# Patient Record
Sex: Female | Born: 1985 | Race: Black or African American | Hispanic: No | Marital: Single | Smoking: Former smoker
Health system: Southern US, Community
[De-identification: ages and names within clinical notes are randomized; demographics above are authoritative.]

## PROBLEM LIST (undated history)

## (undated) DIAGNOSIS — G43909 Migraine, unspecified, not intractable, without status migrainosus: Secondary | ICD-10-CM

## (undated) DIAGNOSIS — M419 Scoliosis, unspecified: Secondary | ICD-10-CM

## (undated) DIAGNOSIS — R011 Cardiac murmur, unspecified: Secondary | ICD-10-CM

## (undated) DIAGNOSIS — F410 Panic disorder [episodic paroxysmal anxiety] without agoraphobia: Secondary | ICD-10-CM

## (undated) DIAGNOSIS — R87629 Unspecified abnormal cytological findings in specimens from vagina: Secondary | ICD-10-CM

## (undated) HISTORY — DX: Unspecified abnormal cytological findings in specimens from vagina: R87.629

## (undated) HISTORY — DX: Cardiac murmur, unspecified: R01.1

## (undated) HISTORY — DX: Panic disorder (episodic paroxysmal anxiety): F41.0

## (undated) HISTORY — DX: Migraine, unspecified, not intractable, without status migrainosus: G43.909

---
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
## 2008-01-01 ENCOUNTER — Emergency Department (HOSPITAL_COMMUNITY): Admission: EM | Admit: 2008-01-01 | Discharge: 2008-01-01 | Payer: Self-pay | Admitting: Emergency Medicine

## 2010-04-19 ENCOUNTER — Emergency Department (HOSPITAL_BASED_OUTPATIENT_CLINIC_OR_DEPARTMENT_OTHER): Admission: EM | Admit: 2010-04-19 | Discharge: 2010-04-19 | Payer: Self-pay | Admitting: Emergency Medicine

## 2010-04-19 ENCOUNTER — Ambulatory Visit: Payer: Self-pay | Admitting: Diagnostic Radiology

## 2011-09-06 LAB — WET PREP, GENITAL
Clue Cells Wet Prep HPF POC: NONE SEEN
Trich, Wet Prep: NONE SEEN

## 2011-09-06 LAB — URINALYSIS, ROUTINE W REFLEX MICROSCOPIC
Glucose, UA: NEGATIVE
Hgb urine dipstick: NEGATIVE
Ketones, ur: >80 — AB
Nitrite: NEGATIVE
Protein, ur: NEGATIVE
Specific Gravity, Urine: 1.021
Urobilinogen, UA: 0.2
pH: 6

## 2011-09-06 LAB — GC/CHLAMYDIA PROBE AMP, GENITAL: Chlamydia, DNA Probe: NEGATIVE

## 2011-09-06 LAB — POCT PREGNANCY, URINE
Operator id: 198171
Preg Test, Ur: NEGATIVE

## 2012-04-13 ENCOUNTER — Telehealth: Payer: Self-pay | Admitting: Hematology and Oncology

## 2012-04-13 NOTE — Telephone Encounter (Signed)
lmonvm of the pt adviisng him of the new pt appt with dr odogwu on 04/17/2012. Asked the pt to call back to confirm the message.

## 2012-04-16 ENCOUNTER — Telehealth: Payer: Self-pay | Admitting: Hematology and Oncology

## 2012-04-16 NOTE — Telephone Encounter (Signed)
Referred by Dr. Shaune Pollack Dx- Neutropenia

## 2012-04-17 ENCOUNTER — Ambulatory Visit: Payer: Self-pay

## 2012-04-17 ENCOUNTER — Ambulatory Visit: Payer: Self-pay | Admitting: Hematology and Oncology

## 2013-01-27 ENCOUNTER — Emergency Department (INDEPENDENT_AMBULATORY_CARE_PROVIDER_SITE_OTHER)
Admission: EM | Admit: 2013-01-27 | Discharge: 2013-01-27 | Disposition: A | Discharge status: Home / Self Care | Payer: BC Managed Care – PPO | Source: Home / Self Care | Attending: Emergency Medicine | Admitting: Emergency Medicine

## 2013-01-27 ENCOUNTER — Encounter (HOSPITAL_COMMUNITY): Payer: Self-pay | Admitting: *Deleted

## 2013-01-27 ENCOUNTER — Other Ambulatory Visit (HOSPITAL_COMMUNITY)
Admission: RE | Admit: 2013-01-27 | Discharge: 2013-01-27 | Disposition: A | Discharge status: Home / Self Care | Payer: BC Managed Care – PPO | Source: Ambulatory Visit | Attending: Emergency Medicine | Admitting: Emergency Medicine

## 2013-01-27 DIAGNOSIS — N760 Acute vaginitis: Secondary | ICD-10-CM | POA: Insufficient documentation

## 2013-01-27 DIAGNOSIS — Z113 Encounter for screening for infections with a predominantly sexual mode of transmission: Secondary | ICD-10-CM | POA: Insufficient documentation

## 2013-01-27 DIAGNOSIS — N898 Other specified noninflammatory disorders of vagina: Secondary | ICD-10-CM

## 2013-01-27 LAB — POCT URINALYSIS DIP (DEVICE)
Glucose, UA: NEGATIVE mg/dL
Nitrite: NEGATIVE

## 2013-01-27 MED ORDER — METRONIDAZOLE 500 MG PO TABS: 500.0000 mg | ORAL_TABLET | Freq: Two times a day (BID) | ORAL | Status: DC

## 2013-01-27 MED ORDER — FLUCONAZOLE 150 MG PO TABS: 150.0000 mg | ORAL_TABLET | Freq: Once | ORAL | Status: DC

## 2013-01-27 NOTE — ED Provider Notes (Signed)
History     CSN: [id number]  Arrival date & time 01/27/13  1151   First MD Initiated Contact with Patient 01/27/13 1159      Chief Complaint  Patient presents with  . Vaginal Discharge    (Consider location/radiation/quality/duration/timing/severity/associated sxs/prior treatment) HPI Comments: Patient presents urgent care describing that she has been having a vaginal discharge some degree of irritation in her external genitalia for about a week. She states that she experiences this sporadic and recurrent episodes of discharge and irritation after she finishes with her periods. In the past she has been diagnosed with both bacterial vaginosis and focal infections. Patient describes she has no concerns for STDs as she is not currently sexually active for many months and has no concerns for a potential exposure, as she has had negative test in the recent past. Patient denies any pelvic pain, urinary symptoms fevers flank pain or vaginal bleeding. Also denies any rashes to her external genitalia.  Patient is a 27 y.o. female presenting with vaginal discharge. The history is provided by the patient.  Vaginal Discharge This is a new problem. The current episode started more than 1 week ago. The problem occurs constantly. The problem has been gradually worsening. Pertinent negatives include no abdominal pain. Nothing aggravates the symptoms. Nothing relieves the symptoms. She has tried nothing for the symptoms.    History reviewed. No pertinent past medical history.  History reviewed. No pertinent past surgical history.  History reviewed. No pertinent family history.  History  Substance Use Topics  . Smoking status: Current Every Day Smoker  . Smokeless tobacco: Not on file  . Alcohol Use: Yes    OB History   Grav Para Term Preterm Abortions TAB SAB Ect Mult Living                  Review of Systems  Constitutional: Negative for activity change and appetite change.   Gastrointestinal: Negative for abdominal pain.  Genitourinary: Positive for vaginal discharge. Negative for dysuria, urgency, frequency, flank pain, decreased urine volume, vaginal bleeding and genital sores.    Allergies  Review of patient's allergies indicates no known allergies.  Home Medications   Current Outpatient Rx  Name  Route  Sig  Dispense  Refill  . metroNIDAZOLE (FLAGYL) 500 MG tablet   Oral   Take 1 tablet (500 mg total) by mouth 2 (two) times daily.   14 tablet   0     BP 112/76  Pulse 72  Temp(Src) 98.6 F (37 C) (Oral)  SpO2 100%  LMP 01/04/2013  Physical Exam  Nursing note and vitals reviewed. Constitutional: She appears well-developed and well-nourished.  Abdominal: Soft. She exhibits no distension. There is no tenderness. There is no rebound and no guarding.  Genitourinary: Vaginal discharge found.  Patient opted to be screened with a urine cytology study    ED Course  Procedures (including critical care time)  Labs Reviewed  POCT URINALYSIS DIP (DEVICE) - Abnormal; Notable for the following:    Protein, ur 30 (*)    All other components within normal limits  POCT PREGNANCY, URINE  URINE CYTOLOGY ANCILLARY ONLY   No results found.   1. Vaginal discharge       MDM  Vaginal discharge, patient is being screened for both STDs and can do that and Gardnerella vaginitis. As patient expressed a significant order to urgent vaginal discharge she was in agreement to start with Flagyl for empiric treatment of bacterial vaginosis. Patient aware that  we will contact her if abnormal test results will require further treatment or management        Jimmie Molly, MD 01/27/13 1330

## 2013-01-27 NOTE — ED Notes (Signed)
Pt  Reports  Symptoms  Of  A  Vaginal  Discharge  With    Itching  And  Irritation     X  1  Week   She  States  These  Are  reoccuring  After  Her  Periods

## 2013-02-03 NOTE — ED Notes (Signed)
Patient called, had questions regarding her lab reports. After verifying ID, discussed findings

## 2013-07-07 ENCOUNTER — Other Ambulatory Visit: Payer: Self-pay | Admitting: Family Medicine

## 2013-07-07 ENCOUNTER — Other Ambulatory Visit (HOSPITAL_COMMUNITY)
Admission: RE | Admit: 2013-07-07 | Discharge: 2013-07-07 | Disposition: A | Discharge status: Home / Self Care | Payer: BC Managed Care – PPO | Source: Ambulatory Visit | Attending: Family Medicine | Admitting: Family Medicine

## 2013-07-07 DIAGNOSIS — Z124 Encounter for screening for malignant neoplasm of cervix: Secondary | ICD-10-CM | POA: Insufficient documentation

## 2014-06-11 ENCOUNTER — Emergency Department (HOSPITAL_COMMUNITY)
Admission: EM | Admit: 2014-06-11 | Discharge: 2014-06-11 | Disposition: A | Discharge status: Home / Self Care | Payer: BC Managed Care – PPO | Attending: Emergency Medicine | Admitting: Emergency Medicine

## 2014-06-11 ENCOUNTER — Emergency Department (HOSPITAL_COMMUNITY): Payer: BC Managed Care – PPO

## 2014-06-11 ENCOUNTER — Encounter (HOSPITAL_COMMUNITY): Payer: Self-pay | Admitting: Emergency Medicine

## 2014-06-11 DIAGNOSIS — S161XXA Strain of muscle, fascia and tendon at neck level, initial encounter: Secondary | ICD-10-CM

## 2014-06-11 DIAGNOSIS — Z8679 Personal history of other diseases of the circulatory system: Secondary | ICD-10-CM | POA: Insufficient documentation

## 2014-06-11 DIAGNOSIS — R011 Cardiac murmur, unspecified: Secondary | ICD-10-CM | POA: Insufficient documentation

## 2014-06-11 DIAGNOSIS — F17200 Nicotine dependence, unspecified, uncomplicated: Secondary | ICD-10-CM | POA: Insufficient documentation

## 2014-06-11 DIAGNOSIS — S139XXA Sprain of joints and ligaments of unspecified parts of neck, initial encounter: Secondary | ICD-10-CM | POA: Insufficient documentation

## 2014-06-11 DIAGNOSIS — Y9389 Activity, other specified: Secondary | ICD-10-CM | POA: Insufficient documentation

## 2014-06-11 DIAGNOSIS — Y92410 Unspecified street and highway as the place of occurrence of the external cause: Secondary | ICD-10-CM | POA: Insufficient documentation

## 2014-06-11 DIAGNOSIS — Z8659 Personal history of other mental and behavioral disorders: Secondary | ICD-10-CM | POA: Insufficient documentation

## 2014-06-11 HISTORY — DX: Scoliosis, unspecified: M41.9

## 2014-06-11 MED ORDER — DIAZEPAM 5 MG PO TABS: 5.0000 mg | ORAL_TABLET | Freq: Two times a day (BID) | ORAL | Status: DC

## 2014-06-11 MED ORDER — KETOROLAC TROMETHAMINE 60 MG/2ML IM SOLN
60.0000 mg | Freq: Once | INTRAMUSCULAR | Status: AC
  Administered 2014-06-11: 60 mg via INTRAMUSCULAR
  Filled 2014-06-11: qty 2

## 2014-06-11 MED ORDER — NAPROXEN 500 MG PO TABS: 500.0000 mg | ORAL_TABLET | Freq: Two times a day (BID) | ORAL | Status: DC
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
## 2014-06-11 NOTE — Discharge Instructions (Signed)
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
## 2014-06-12 NOTE — ED Provider Notes (Signed)
Medical screening examination/treatment/procedure(s) were performed by non-physician practitioner and as supervising physician I was immediately available for consultation/collaboration.  Flint MelterElliott L Cecile Gillispie, MD 06/12/14 Marlyne Beards0002

## 2017-02-02 ENCOUNTER — Ambulatory Visit (HOSPITAL_COMMUNITY)
Admission: EM | Admit: 2017-02-02 | Discharge: 2017-02-02 | Disposition: A | Discharge status: Home / Self Care | Payer: 59 | Attending: Family Medicine | Admitting: Family Medicine

## 2017-02-02 ENCOUNTER — Encounter (HOSPITAL_COMMUNITY): Payer: Self-pay | Admitting: Emergency Medicine

## 2017-02-02 DIAGNOSIS — N898 Other specified noninflammatory disorders of vagina: Secondary | ICD-10-CM | POA: Diagnosis not present

## 2017-02-02 DIAGNOSIS — H5789 Other specified disorders of eye and adnexa: Secondary | ICD-10-CM

## 2017-02-02 MED ORDER — FLUORESCEIN SODIUM 0.6 MG OP STRP
ORAL_STRIP | OPHTHALMIC | Status: AC
  Filled 2017-02-02: qty 1

## 2017-02-02 MED ORDER — TETRACAINE HCL 0.5 % OP SOLN
OPHTHALMIC | Status: AC
  Filled 2017-02-02: qty 2

## 2017-02-02 MED ORDER — OLOPATADINE HCL 0.1 % OP SOLN: 1.0000 [drp] | Freq: Two times a day (BID) | OPHTHALMIC | 1 refills | Status: AC

## 2017-02-02 NOTE — ED Provider Notes (Signed)
CSN: [id number]     Arrival date & time 02/02/17  1911 History   First MD Initiated Contact with Patient 02/02/17 2049     Chief Complaint  Patient presents with  . Vaginal Discharge  . Eye Problem   (Consider location/radiation/quality/duration/timing/severity/associated sxs/prior Treatment) HPI Dawn Bowers is a 31 y.o. female presenting to UC with c/o malodorous white vaginal discharge for about 1 week.  Denies concern for STIs as she has not been sexually active in at least 1 year.  Denies abdominal pain or back pain. Denies n/v/d. LMP: 01/20/17. She has not tried anything for her symptoms. No recent antibiotic use.  Pt also c/o unrelated Left eye irritation that started about 2 days ago. Pt does wear contacts but took the Left one out due to irritation. Pt notes it feels like an eyelash is stuck in her eye. No known injury. Denies change in vision. No recent illness or cough or congestion. Pt does note that she recently moved from [place] to [place] and wonders if she has new environmental allergies such as the pollen.    Past Medical History:  Diagnosis Date  . Abnormal Pap smear of vagina    Hx of HPV, ? some procedure  . Heart murmur    ECHO 04/21/12 Normal LVF < mild TR, trivial MR.  . Migraine   . Panic disorder   . Scoliosis    History reviewed. No pertinent surgical history. Family History  Problem Relation Age of Onset  . Cancer Maternal Aunt     breast  . Heart disease Maternal Grandmother     CABG in 65s  . Cancer Cousin     kidney  . Cancer Cousin     lung   Social History  Substance Use Topics  . Smoking status: Light Tobacco Smoker    Types: Cigarettes  . Smokeless tobacco: Not on file     Comment: 1 cigarette a day  . Alcohol use Yes     Comment: 1-2 drinks/month   OB History    No data available     Review of Systems  Constitutional: Negative for chills and fever.  Eyes: Positive for discharge and itching. Negative for photophobia, pain, redness and visual  disturbance.       Left- scant crusting discharge  Gastrointestinal: Negative for abdominal pain, diarrhea, nausea and vomiting.  Genitourinary: Positive for vaginal discharge. Negative for dysuria, flank pain, frequency, genital sores, hematuria, urgency, vaginal bleeding and vaginal pain.  Musculoskeletal: Negative for back pain.  Skin: Negative for rash.  Neurological: Negative for dizziness, light-headedness and headaches.    Allergies  Patient has no known allergies.  Home Medications   Prior to Admission medications   Medication Sig Start Date End Date Taking? Authorizing Provider  olopatadine (PATANOL) 0.1 % ophthalmic solution Place 1 drop into the left eye 2 (two) times daily. For eye irritation. May keep in refrigerator. 02/02/17   Junius Finner, PA-C   Meds Ordered and Administered this Visit  Medications - No data to display  BP 103/58 (BP Location: Right Arm)   Pulse 64   Temp 99 F (37.2 C) (Oral)   Resp 16   LMP 01/20/2017 (Exact Date)   SpO2 100%  No data found.   Physical Exam  Constitutional: She is oriented to person, place, and time. She appears well-developed and well-nourished. No distress.  HENT:  Head: Normocephalic and atraumatic.  Right Ear: Tympanic membrane normal.  Left Ear: Tympanic membrane normal.  Nose: Nose  normal.  Mouth/Throat: Uvula is midline, oropharynx is clear and moist and mucous membranes are normal.  Eyes: EOM are normal. Pupils are equal, round, and reactive to light. Right eye exhibits no discharge. Left eye exhibits no discharge.  No fluorescein uptake. Left eye and eyelashes appear normal.  Neck: Normal range of motion. Neck supple.  Cardiovascular: Normal rate and regular rhythm.   Pulmonary/Chest: Effort normal and breath sounds normal. No stridor. No respiratory distress. She has no wheezes. She has no rales.  Genitourinary:  Genitourinary Comments: Chaperoned exam. Normal external genitalia. Vaginal canal- moderate amount  of thick while discharge. No bleeding. No CMT, adnexal tenderness or masses.   Musculoskeletal: Normal range of motion.  Lymphadenopathy:    She has no cervical adenopathy.  Neurological: She is alert and oriented to person, place, and time.  Skin: Skin is warm and dry. She is not diaphoretic.  Psychiatric: She has a normal mood and affect. Her behavior is normal.  Nursing note and vitals reviewed.   Urgent Care Course     Procedures (including critical care time)  Labs Review Labs Reviewed  CERVICOVAGINAL ANCILLARY ONLY    Imaging Review No results found.    MDM   1. Vaginal discharge   2. Irritation of left eye    Vaginal discharge likely due to yeast infection. Wet prep sent to lab. Treatment help off until labs result. Pt encouraged to call UC on Wednesday or Thursday (2/21-2/22) if she does not hear back about results.  Eye exam. No evidence of corneal abrasion or corneal ulcer.   Rx: Patanol or OTC refresh eye drops. Discouraged wearing her contacts at night even though she says they are able to be worn during sleep.   F/u with PCP as needed.    Junius FinnerErin O'Malley, PA-C 02/03/17 [id number]

## 2017-02-02 NOTE — ED Triage Notes (Signed)
[facility].  The patient stated that she has been having vaginal discomfort and some discharge x 1 week. The patient stated that she was not sexually active.  The patient also reported left eye pain and irritation x 2 days.

## 2017-02-02 NOTE — Discharge Instructions (Signed)
°  You may try the prescribed eye drops or over the counter Refresh eye drops.  You may also try an over the counter antihistamine such as Claritin or Zyrtec. Generic versions are okay too.    For your pelvic exam, the results should come back in about 2-3 days. If you do not hear back from our urgent care by mid week, you may call to check on results.

## 2017-02-03 LAB — CERVICOVAGINAL ANCILLARY ONLY: Wet Prep (BD Affirm): POSITIVE — AB

## 2017-02-04 ENCOUNTER — Telehealth: Payer: Self-pay | Admitting: Internal Medicine

## 2017-02-04 MED ORDER — METRONIDAZOLE 500 MG PO TABS: 500.0000 mg | ORAL_TABLET | Freq: Two times a day (BID) | ORAL | 0 refills | Status: AC

## 2017-02-04 NOTE — Telephone Encounter (Signed)
Clinical staff please let patient know that test for gardnerella was positive.  Rx for metronidazole [facility] [address] [facility] [address]  Hennie Duosecheck for further evaluation if symptoms are not improving.  LM

## 2017-02-05 ENCOUNTER — Telehealth (HOSPITAL_COMMUNITY): Payer: Self-pay | Admitting: Emergency Medicine

## 2017-02-05 MED ORDER — FLUCONAZOLE 150 MG PO TABS: 150.0000 mg | ORAL_TABLET | Freq: Every day | ORAL | 0 refills | Status: AC

## 2017-02-05 NOTE — Telephone Encounter (Signed)
-----   Message from Eustace MooreLaura W Murray, MD sent at 02/04/2017  8:09 AM EST ----- Clinical staff please let patient know that test for gardnerella was positive.  Rx for metronidazole [facility] [address] [facility] [address]  Hennie Duosecheck for further evaluation if symptoms are not improving.  LM

## 2017-02-05 NOTE — Telephone Encounter (Signed)
Pt called needing results... notified of recent lab results (BV) Pt ID'd properly... Reports feeling better and fine Notified of pending Rx... Also per her request, wanted to know if we could call in Diflucan Per bill O,... Ok to call in Diflucan 150 mg PO 1 tab daily #2 Pt wants [facility] to [facility] ([address]) Adv pt if sx are not getting better to return or to f/u w/PCP Notified pt that lab results can be obtained through MyChart Pt verb understanding.

## 2017-11-03 ENCOUNTER — Emergency Department (HOSPITAL_COMMUNITY)
Admission: EM | Admit: 2017-11-03 | Discharge: 2017-11-03 | Disposition: A | Discharge status: Home / Self Care | Payer: 59 | Attending: Emergency Medicine | Admitting: Emergency Medicine

## 2017-11-03 ENCOUNTER — Emergency Department (HOSPITAL_COMMUNITY): Payer: 59

## 2017-11-03 ENCOUNTER — Other Ambulatory Visit: Payer: Self-pay

## 2017-11-03 ENCOUNTER — Encounter (HOSPITAL_COMMUNITY): Payer: Self-pay

## 2017-11-03 DIAGNOSIS — M419 Scoliosis, unspecified: Secondary | ICD-10-CM | POA: Insufficient documentation

## 2017-11-03 DIAGNOSIS — Z87891 Personal history of nicotine dependence: Secondary | ICD-10-CM | POA: Insufficient documentation

## 2017-11-03 DIAGNOSIS — Y92410 Unspecified street and highway as the place of occurrence of the external cause: Secondary | ICD-10-CM | POA: Insufficient documentation

## 2017-11-03 DIAGNOSIS — S39012A Strain of muscle, fascia and tendon of lower back, initial encounter: Secondary | ICD-10-CM

## 2017-11-03 DIAGNOSIS — Y998 Other external cause status: Secondary | ICD-10-CM | POA: Diagnosis not present

## 2017-11-03 DIAGNOSIS — Y939 Activity, unspecified: Secondary | ICD-10-CM | POA: Insufficient documentation

## 2017-11-03 DIAGNOSIS — S3992XA Unspecified injury of lower back, initial encounter: Secondary | ICD-10-CM | POA: Diagnosis present

## 2017-11-03 LAB — POC URINE PREG, ED: Preg Test, Ur: NEGATIVE

## 2017-11-03 MED ORDER — NAPROXEN 500 MG PO TABS: 500.0000 mg | ORAL_TABLET | Freq: Two times a day (BID) | ORAL | 0 refills | Status: AC

## 2017-11-03 MED ORDER — CYCLOBENZAPRINE HCL 10 MG PO TABS: 10.0000 mg | ORAL_TABLET | Freq: Two times a day (BID) | ORAL | 0 refills | Status: AC | PRN
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
## 2017-11-03 NOTE — Discharge Instructions (Signed)
Please read the attached information regarding your condition. Apply heat to affected area as needed. Take naproxen and Flexeril as needed. Complete stretches of back to prevent stiffness. Return to ED for worsening pain, numbness in legs, additional injury, loss of bladder function, chest pain or trouble breathing.

## 2018-02-05 ENCOUNTER — Encounter: Payer: 59 | Attending: Nurse Practitioner | Admitting: Nurse Practitioner

## 2018-02-05 DIAGNOSIS — F419 Anxiety disorder, unspecified: Secondary | ICD-10-CM | POA: Diagnosis not present

## 2018-02-05 DIAGNOSIS — I252 Old myocardial infarction: Secondary | ICD-10-CM | POA: Diagnosis not present

## 2018-02-05 DIAGNOSIS — Z8249 Family history of ischemic heart disease and other diseases of the circulatory system: Secondary | ICD-10-CM | POA: Diagnosis not present

## 2018-02-05 DIAGNOSIS — I739 Peripheral vascular disease, unspecified: Secondary | ICD-10-CM | POA: Diagnosis not present

## 2018-02-05 DIAGNOSIS — S61211A Laceration without foreign body of left index finger without damage to nail, initial encounter: Secondary | ICD-10-CM | POA: Insufficient documentation

## 2018-02-05 DIAGNOSIS — Z87891 Personal history of nicotine dependence: Secondary | ICD-10-CM | POA: Insufficient documentation

## 2018-02-05 DIAGNOSIS — W25XXXA Contact with sharp glass, initial encounter: Secondary | ICD-10-CM | POA: Insufficient documentation

## 2018-02-05 DIAGNOSIS — Y93G1 Activity, food preparation and clean up: Secondary | ICD-10-CM | POA: Diagnosis not present

## 2018-02-08 NOTE — Progress Notes (Signed)
Dawn Bowers ([id number]) Visit Report for 02/05/2018 Allergy List Details Patient Name: Dawn Bowers, Dawn Bowers Date of Service: 02/05/2018 8:00 AM Medical Record Number: [id number] Patient Account Number: [id number] Date of Birth/Sex: [birth year] (31 y.o. Female) Treating RN: Renne Crigler Primary Care Uziel Covault: PATIENT, NO Other Clinician: Referring Audreyanna Butkiewicz: Ronney Lion Treating Waynesha Rammel/Extender: Bonnell Public Weeks in Treatment: 0 Allergies Active Allergies No Known Allergies Allergy Notes Electronic Signature(s) Signed: 02/06/2018 4:58:29 PM By: Renne Crigler Entered By: Renne Crigler on 02/05/2018 08:27:22 Dawn Bowers ([id number]) -------------------------------------------------------------------------------- Arrival Information Details Patient Name: Dawn Bowers Date of Service: 02/05/2018 8:00 AM Medical Record Number: [id number] Patient Account Number: [id number] Date of Birth/Sex: [birth year] (31 y.o. Female) Treating RN: Renne Crigler Primary Care Raeonna Milo: PATIENT, NO Other Clinician: Referring Yassmin Binegar: Ronney Lion Treating Brittnei Jagiello/Extender: Kathreen Cosier in Treatment: 0 Visit Information Patient Arrived: Ambulatory Arrival Time: 08:19 Accompanied By: self Transfer Assistance: None Patient Identification Verified: Yes Secondary Verification Process Completed: Yes Electronic Signature(s) Signed: 02/06/2018 4:58:29 PM By: Renne Crigler Entered By: Renne Crigler on 02/05/2018 08:25:40 [name], [name] ([id number]) -------------------------------------------------------------------------------- Clinic Level of Care Assessment Details Patient Name: Dawn Bowers, Dawn Bowers Date of Service: 02/05/2018 8:00 AM Medical Record Number: [id number] Patient Account Number: [id number] Date of Birth/Sex: 1986/08/29 (31 y.o. Female) Treating RN: Ashok Cordia, Debi Primary Care Atiana Levier: PATIENT, NO Other Clinician: Referring Kiona Blume: Ronney Lion Treating  Debbe Crumble/Extender: Kathreen Cosier in Treatment: 0 Clinic Level of Care Assessment Items TOOL 2 Quantity Score X - Use when only an EandM is performed on the INITIAL visit 1 0 ASSESSMENTS - Nursing Assessment / Reassessment X - General Physical Exam (combine w/ comprehensive assessment (listed just below) when 1 20 performed on new pt. evals) X- 1 25 Comprehensive Assessment (HX, ROS, Risk Assessments, Wounds Hx, etc.) ASSESSMENTS - Wound and Skin Assessment / Reassessment X - Simple Wound Assessment / Reassessment - one wound 1 5 []  - 0 Complex Wound Assessment / Reassessment - multiple wounds []  - 0 Dermatologic / Skin Assessment (not related to wound area) ASSESSMENTS - Ostomy and/or Continence Assessment and Care []  - Incontinence Assessment and Management 0 []  - 0 Ostomy Care Assessment and Management (repouching, etc.) PROCESS - Coordination of Care X - Simple Patient / Family Education for ongoing care 1 15 []  - 0 Complex (extensive) Patient / Family Education for ongoing care []  - 0 Staff obtains Chiropractor, Records, Test Results / Process Orders []  - 0 Staff telephones HHA, Nursing Homes / Clarify orders / etc []  - 0 Routine Transfer to another Facility (non-emergent condition) []  - 0 Routine Hospital Admission (non-emergent condition) []  - 0 New Admissions / Manufacturing engineer / Ordering NPWT, Apligraf, etc. []  - 0 Emergency Hospital Admission (emergent condition) X- 1 10 Simple Discharge Coordination []  - 0 Complex (extensive) Discharge Coordination PROCESS - Special Needs []  - Pediatric / Minor Patient Management 0 []  - 0 Isolation Patient Management Dawn Bowers, Dawn Bowers ([id number]) []  - 0 Hearing / Language / Visual special needs []  - 0 Assessment of Community assistance (transportation, D/C planning, etc.) []  - 0 Additional assistance / Altered mentation []  - 0 Support Surface(s) Assessment (bed, cushion, seat, etc.) INTERVENTIONS - Wound  Cleansing / Measurement X - Wound Imaging (photographs - any number of wounds) 1 5 []  - 0 Wound Tracing (instead of photographs) X- 1 5 Simple Wound Measurement - one wound []  - 0 Complex Wound Measurement - multiple wounds X- 1 5 Simple Wound Cleansing - one wound []  - 0 Complex Wound Cleansing - multiple wounds INTERVENTIONS - Wound Dressings X -  Small Wound Dressing one or multiple wounds 1 10 []  - 0 Medium Wound Dressing one or multiple wounds []  - 0 Large Wound Dressing one or multiple wounds []  - 0 Application of Medications - injection INTERVENTIONS - Miscellaneous []  - External ear exam 0 []  - 0 Specimen Collection (cultures, biopsies, blood, body fluids, etc.) []  - 0 Specimen(s) / Culture(s) sent or taken to Lab for analysis []  - 0 Patient Transfer (multiple staff / Nurse, adult / Similar devices) []  - 0 Simple Staple / Suture removal (25 or less) []  - 0 Complex Staple / Suture removal (26 or more) []  - 0 Hypo / Hyperglycemic Management (close monitor of Blood Glucose) []  - 0 Ankle / Brachial Index (ABI) - do not check if billed separately Has the patient been seen at the hospital within the last three years: Yes Total Score: 100 Level Of Care: New/Established - Level 3 Electronic Signature(s) Signed: 02/06/2018 8:00:35 AM By: Alejandro Mulling Entered By: Alejandro Mulling on 02/05/2018 17:12:18 [name], [name] ([id number]) -------------------------------------------------------------------------------- Encounter Discharge Information Details Patient Name: Dawn Bowers Date of Service: 02/05/2018 8:00 AM Medical Record Number: [id number] Patient Account Number: [id number] Date of Birth/Sex: 1986-01-10 (31 y.o. Female) Treating RN: Ashok Cordia, Debi Primary Care Arick Mareno: PATIENT, NO Other Clinician: Referring Vanya Carberry: Ronney Lion Treating Carmon Sahli/Extender: Kathreen Cosier in Treatment: 0 Encounter Discharge Information Items Schedule Follow-up  Appointment: No Medication Reconciliation completed and No provided to Patient/Care Rosalyn Archambault: Provided on Clinical Summary of Care: 02/05/2018 Form Type Recipient Paper Patient [facility] Electronic Signature(s) Signed: 02/06/2018 8:16:02 AM By: Gwenlyn Perking Entered By: Gwenlyn Perking on 02/05/2018 08:55:03 [place], Tosca ([id number]) -------------------------------------------------------------------------------- Multi Wound Chart Details Patient Name: Dawn Bowers Date of Service: 02/05/2018 8:00 AM Medical Record Number: [id number] Patient Account Number: [id number] Date of Birth/Sex: 12/10/1986 (31 y.o. Female) Treating RN: Ashok Cordia, Debi Primary Care Adonys Wildes: PATIENT, NO Other Clinician: Referring Avanthika Dehnert: Ronney Lion Treating Kaedyn Belardo/Extender: Kathreen Cosier in Treatment: 0 Vital Signs Height(in): 62 Pulse(bpm): 88 Weight(lbs): 114 Blood Pressure(mmHg): 144/95 Body Mass Index(BMI): 21 Temperature(F): 98.0 Respiratory Rate 16 (breaths/min): Photos: [1:No Photos] [N/A:N/A] Wound Location: [1:Left Hand - 2nd Digit] [N/A:N/A] Wounding Event: [1:Trauma] [N/A:N/A] Primary Etiology: [1:Trauma, Other] [N/A:N/A] Date Acquired: [1:02/01/2018] [N/A:N/A] Weeks of Treatment: [1:0] [N/A:N/A] Wound Status: [1:Open] [N/A:N/A] Measurements L x W x D [1:0.8x0.6x0.2] [N/A:N/A] (cm) Area (cm) : [1:0.377] [N/A:N/A] Volume (cm) : [1:0.075] [N/A:N/A] Classification: [1:Full Thickness Without Exposed Support Structures] [N/A:N/A] Exudate Amount: [1:Large] [N/A:N/A] Exudate Type: [1:Sanguinous] [N/A:N/A] Exudate Color: [1:red] [N/A:N/A] Wound Margin: [1:Flat and Intact] [N/A:N/A] Granulation Amount: [1:None Present (0%)] [N/A:N/A] Necrotic Amount: [1:Large (67-100%)] [N/A:N/A] Necrotic Tissue: [1:Eschar, Adherent Slough] [N/A:N/A] Exposed Structures: [1:Fat Layer (Subcutaneous Tissue) Exposed: Yes Fascia: No Tendon: No Muscle: No Joint: No Bone: No]  [N/A:N/A] Epithelialization: [1:None] [N/A:N/A] Periwound Skin Texture: [1:Excoriation: No Induration: No Callus: No Crepitus: No Rash: No Scarring: No] [N/A:N/A] Periwound Skin Moisture: [1:Maceration: No Dry/Scaly: No] [N/A:N/A] Periwound Skin Color: [N/A:N/A] Erythema: Yes Atrophie Blanche: No Cyanosis: No Ecchymosis: No Hemosiderin Staining: No Mottled: No Pallor: No Rubor: No Erythema Location: Circumferential N/A N/A Temperature: No Abnormality N/A N/A Tenderness on Palpation: Yes N/A N/A Wound Preparation: Ulcer Cleansing: N/A N/A Rinsed/Irrigated with Saline Topical Anesthetic Applied: Other: lidocaine 4% Treatment Notes Electronic Signature(s) Signed: 02/05/2018 8:54:53 AM By: Bonnell Public Entered By: Bonnell Public on 02/05/2018 08:54:53 Dawn Bowers, Dawn Bowers ([id number]) -------------------------------------------------------------------------------- Multi-Disciplinary Care Plan Details Patient Name: Dawn Bowers Date of Service: 02/05/2018 8:00 AM Medical Record Number: [id number] Patient Account Number: [id number] Date of Birth/Sex: [birth year] (31 y.o. Female) Treating  RN: Phillis HaggisPinkerton, Debi Primary Care Trinna Kunst: PATIENT, NO Other Clinician: Referring Jannel Lynne: Ronney LionHAZY, Dawn Treating Duante Arocho/Extender: Kathreen Cosieroulter, Leah Weeks in Treatment: 0 Active Inactive ` Orientation to the Wound Care Program Nursing Diagnoses: Knowledge deficit related to the wound healing center program Goals: Patient/caregiver will verbalize understanding of the Wound Healing Center Program Date Initiated: 02/05/2018 Target Resolution Date: 02/21/2018 Goal Status: Active Interventions: Provide education on orientation to the wound center Notes: ` Pain, Acute or Chronic Nursing Diagnoses: Pain, acute or chronic: actual or potential Potential alteration in comfort, pain Goals: Patient/caregiver will verbalize adequate pain control between visits Date Initiated: 02/05/2018 Target Resolution  Date: 05/23/2018 Goal Status: Active Interventions: Complete pain assessment as per visit requirements Notes: ` Wound/Skin Impairment Nursing Diagnoses: Impaired tissue integrity Knowledge deficit related to ulceration/compromised skin integrity Goals: Ulcer/skin breakdown will have a volume reduction of 80% by week 12 Date Initiated: 02/05/2018 Target Resolution Date: 04/18/2018 Goal Status: Active [place], Oren SectionCACIA ([id number]) Interventions: Assess ulceration(s) every visit Notes: Electronic Signature(s) Signed: 02/06/2018 8:00:35 AM By: Alejandro MullingPinkerton, Debra Entered By: Alejandro MullingPinkerton, Debra on 02/05/2018 08:43:49 [place] East[place]AR[place], Leslea ([id number]) -------------------------------------------------------------------------------- Pain Assessment Details Patient Name: Dawn DingwallHARLES, Dawn Bowers Date of Service: 02/05/2018 8:00 AM Medical Record Number: [id number] Patient Account Number: [id number] Date of Birth/Sex: Apr 02, 1986 (31 y.o. Female) Treating RN: Renne CriglerFlinchum, Cheryl Primary Care Hayde Kilgour: PATIENT, NO Other Clinician: Referring Rhylie Stehr: Ronney LionHAZY, Dawn Treating Napoleon Monacelli/Extender: Kathreen Cosieroulter, Leah Weeks in Treatment: 0 Active Problems Location of Pain Severity and Description of Pain Patient Has Paino Yes Site Locations Pain Location: Pain in Ulcers With Dressing Change: Yes Duration of the Pain. Constant / Intermittento Constant Character of Pain Describe the Pain: Other: stings Pain Management and Medication Current Pain Management: Notes Topical or injectable lidocaine is offered to patient for acute pain when surgical debridement is performed. If needed, Patient is instructed to use over the counter pain medication for the following 24-48 hours after debridement. Wound care MDs do not prescribed pain medications. Patient has chronic pain or uncontrolled pain. Patient has been instructed to make an appointment with their Primary Care Physician for pain management. Electronic Signature(s) Signed:  02/06/2018 4:58:29 PM By: Renne CriglerFlinchum, Cheryl Entered By: Renne CriglerFlinchum, Cheryl on 02/05/2018 08:26:31 Dawn DingwallCHARLES, Dawn Bowers ([id number]) -------------------------------------------------------------------------------- Wound Assessment Details Patient Name: Dawn DingwallHARLES, Dawn Bowers Date of Service: 02/05/2018 8:00 AM Medical Record Number: [id number] Patient Account Number: [id number] Date of Birth/Sex: Apr 02, 1986 (31 y.o. Female) Treating RN: Renne CriglerFlinchum, Cheryl Primary Care Cherylin Waguespack: PATIENT, NO Other Clinician: Referring Jefferey Lippmann: Ronney LionHAZY, Dawn Treating Jubilee Vivero/Extender: Kathreen Cosieroulter, Leah Weeks in Treatment: 0 Wound Status Wound Number: 1 Primary Etiology: Trauma, Other Wound Location: Left Hand - 2nd Digit Wound Status: Open Wounding Event: Trauma Date Acquired: 02/01/2018 Weeks Of Treatment: 0 Clustered Wound: No Photos Photo Uploaded By: Curtis Sitesorthy, Joanna on 02/05/2018 09:05:10 Wound Measurements Length: (cm) 0.8 Width: (cm) 0.6 Depth: (cm) 0.2 Area: (cm) 0.377 Volume: (cm) 0.075 % Reduction in Area: % Reduction in Volume: Epithelialization: None Tunneling: No Undermining: No Wound Description Full Thickness Without Exposed Support Classification: Structures Wound Margin: Flat and Intact Exudate Large Amount: Exudate Type: Sanguinous Exudate Color: red Foul Odor After Cleansing: No Slough/Fibrino Yes Wound Bed Granulation Amount: None Present (0%) Exposed Structure Necrotic Amount: Large (67-100%) Fascia Exposed: No Necrotic Quality: Eschar, Adherent Slough Fat Layer (Subcutaneous Tissue) Exposed: Yes Tendon Exposed: No Muscle Exposed: No Joint Exposed: No Bone Exposed: No Filsinger, Dawn Bowers ([id number]) Periwound Skin Texture Texture Color No Abnormalities Noted: No No Abnormalities Noted: No Callus: No Atrophie Blanche: No Crepitus: No Cyanosis: No Excoriation: No Ecchymosis: No Induration: No Erythema: Yes  Rash: No Erythema Location: Circumferential Scarring:  No Hemosiderin Staining: No Mottled: No Moisture Pallor: No No Abnormalities Noted: No Rubor: No Dry / Scaly: No Maceration: No Temperature / Pain Temperature: No Abnormality Tenderness on Palpation: Yes Wound Preparation Ulcer Cleansing: Rinsed/Irrigated with Saline Topical Anesthetic Applied: Other: lidocaine 4%, Electronic Signature(s) Signed: 02/06/2018 4:58:29 PM By: Renne Crigler Entered By: Renne Crigler on 02/05/2018 08:36:47 KEITH, CANCIO ([id number]) -------------------------------------------------------------------------------- Vitals Details Patient Name: Dawn Bowers Date of Service: 02/05/2018 8:00 AM Medical Record Number: [id number] Patient Account Number: [id number] Date of Birth/Sex: [birth year] (31 y.o. Female) Treating RN: Renne Crigler Primary Care Angle Dirusso: PATIENT, NO Other Clinician: Referring Camiyah Friberg: Ronney Lion Treating Marlos Carmen/Extender: Kathreen Cosier in Treatment: 0 Vital Signs Time Taken: 08:26 Temperature (F): 98.0 Height (in): 62 Pulse (bpm): 88 Source: Measured Respiratory Rate (breaths/min): 16 Weight (lbs): 114 Blood Pressure (mmHg): 144/95 Source: Measured Reference Range: 80 - 120 mg / dl Body Mass Index (BMI): 20.8 Electronic Signature(s) Signed: 02/06/2018 4:58:29 PM By: Renne Crigler Entered By: Renne Crigler on 02/05/2018 08:27:00

## 2018-02-08 NOTE — Progress Notes (Signed)
Dawn, Bowers ([id number]) Visit Report for 02/05/2018 Abuse/Suicide Risk Screen Details Patient Name: Dawn Bowers, Dawn Bowers Date of Service: 02/05/2018 8:00 AM Medical Record Number: [id number] Patient Account Number: [id number] Date of Birth/Sex: 1986/02/28 (31 y.o. Female) Treating RN: Renne Crigler Primary Care Dyanara Cozza: PATIENT, NO Other Clinician: Referring Andrea Ferrer: Ronney Lion Treating Tabrina Esty/Extender: Kathreen Cosier in Treatment: 0 Abuse/Suicide Risk Screen Items Answer ABUSE/SUICIDE RISK SCREEN: Has anyone close to you tried to hurt or harm you recentlyo No Do you feel uncomfortable with anyone in your familyo No Has anyone forced you do things that you didnot want to doo No Do you have any thoughts of harming yourselfo No Patient displays signs or symptoms of abuse and/or neglect. No Electronic Signature(s) Signed: 02/06/2018 4:58:29 PM By: Renne Crigler Entered By: Renne Crigler on 02/05/2018 08:27:32 ARMONI, KLUDT ([id number]) -------------------------------------------------------------------------------- Activities of Daily Living Details Patient Name: Dawn, Bowers Date of Service: 02/05/2018 8:00 AM Medical Record Number: [id number] Patient Account Number: [id number] Date of Birth/Sex: 09/04/1986 (31 y.o. Female) Treating RN: Renne Crigler Primary Care Bre Pecina: PATIENT, NO Other Clinician: Referring Jazilyn Siegenthaler: Ronney Lion Treating Tadd Holtmeyer/Extender: Kathreen Cosier in Treatment: 0 Activities of Daily Living Items Answer Activities of Daily Living (Please select one for each item) Drive Automobile Completely Able Take Medications Completely Able Use Telephone Completely Able Care for Appearance Completely Able Use Toilet Completely Able Bath / Shower Completely Able Dress Self Completely Able Feed Self Completely Able Walk Completely Able Get In / Out Bed Completely Able Housework Completely Able Prepare Meals Completely Able Handle  Money Completely Able Shop for Self Completely Able Electronic Signature(s) Signed: 02/06/2018 4:58:29 PM By: Renne Crigler Entered By: Renne Crigler on 02/05/2018 08:27:54 Norris, [name] ([id number]) -------------------------------------------------------------------------------- Education Assessment Details Patient Name: Dawn Bowers Date of Service: 02/05/2018 8:00 AM Medical Record Number: [id number] Patient Account Number: [id number] Date of Birth/Sex: [birth year] (31 y.o. Female) Treating RN: Renne Crigler Primary Care Mackay Hanauer: PATIENT, NO Other Clinician: Referring Cheyene Hamric: Ronney Lion Treating Eleana Tocco/Extender: Kathreen Cosier in Treatment: 0 Primary Learner Assessed: Patient Learning Preferences/Education Level/Primary Language Learning Preference: Explanation, Demonstration Highest Education Level: College or Above Preferred Language: English Cognitive Barrier Assessment/Beliefs Language Barrier: No Translator Needed: No Memory Deficit: No Emotional Barrier: No Cultural/Religious Beliefs Affecting Medical Care: No Physical Barrier Assessment Impaired Vision: No Impaired Hearing: No Decreased Hand dexterity: No Knowledge/Comprehension Assessment Knowledge Level: Medium Comprehension Level: Medium Ability to understand written Medium instructions: Ability to understand verbal Medium instructions: Motivation Assessment Anxiety Level: Calm Cooperation: Cooperative Education Importance: Acknowledges Need Interest in Health Problems: Asks Questions Perception: Coherent Willingness to Engage in Self- Medium Management Activities: Readiness to Engage in Self- Medium Management Activities: Electronic Signature(s) Signed: 02/06/2018 4:58:29 PM By: Renne Crigler Entered By: Renne Crigler on 02/05/2018 08:28:37 NORRIS, BODLEY ([id number]) -------------------------------------------------------------------------------- Fall Risk Assessment  Details Patient Name: Dawn Bowers Date of Service: 02/05/2018 8:00 AM Medical Record Number: [id number] Patient Account Number: [id number] Date of Birth/Sex: Apr 17, 1986 (31 y.o. Female) Treating RN: Renne Crigler Primary Care Jatasia Gundrum: PATIENT, NO Other Clinician: Referring Renji Berwick: Ronney Lion Treating Jerimie Mancuso/Extender: Kathreen Cosier in Treatment: 0 Fall Risk Assessment Items Have you had 2 or more falls in the last 12 monthso 0 No Have you had any fall that resulted in injury in the last 12 monthso 0 No FALL RISK ASSESSMENT: History of falling - immediate or within 3 months 0 No Secondary diagnosis 0 No Ambulatory aid None/bed rest/wheelchair/nurse 0 Yes Crutches/cane/walker 0 No Furniture 0 No IV Access/Saline Lock 0 No Gait/Training Normal/bed rest/immobile 0 Yes  Weak 0 No Impaired 0 No Mental Status Oriented to own ability 0 Yes Electronic Signature(s) Signed: 02/06/2018 4:58:29 PM By: Renne CriglerFlinchum, Cheryl Entered By: Renne CriglerFlinchum, Cheryl on 02/05/2018 08:28:47 Dawn DingwallCHARLES, Jamiyla ([id number]) -------------------------------------------------------------------------------- Nutrition Risk Assessment Details Patient Name: Dawn DingwallHARLES, Dawn Date of Service: 02/05/2018 8:00 AM Medical Record Number: [id number] Patient Account Number: [id number] Date of Birth/Sex: [birth year] (31 y.o. Female) Treating RN: Renne CriglerFlinchum, Cheryl Primary Care Yaviel Kloster: PATIENT, NO Other Clinician: Referring Dalynn Jhaveri: Ronney LionHAZY, MARIAN Treating Kuper Rennels/Extender: Kathreen Cosieroulter, Leah Weeks in Treatment: 0 Height (in): 62 Weight (lbs): 114 Body Mass Index (BMI): 20.8 Nutrition Risk Assessment Items NUTRITION RISK SCREEN: I have an illness or condition that made me change the kind and/or amount of 0 No food I eat I eat fewer than two meals per day 0 No I eat few fruits and vegetables, or milk products 0 No I have three or more drinks of beer, liquor or wine almost every day 0 No I have tooth or mouth  problems that make it hard for me to eat 0 No I don't always have enough money to buy the food I need 0 No I eat alone most of the time 0 No I take three or more different prescribed or over-the-counter drugs a day 1 Yes Without wanting to, I have lost or gained 10 pounds in the last six months 0 No I am not always physically able to shop, cook and/or feed myself 0 No Nutrition Protocols Good Risk Protocol 0 No interventions needed Moderate Risk Protocol Electronic Signature(s) Signed: 02/06/2018 4:58:29 PM By: Renne CriglerFlinchum, Cheryl Entered By: Renne CriglerFlinchum, Cheryl on 02/05/2018 51:[id number]:28:54

## 2018-02-08 NOTE — Progress Notes (Addendum)
Dawn Bowers, Dawn Bowers ([id number]) Visit Report for 02/05/2018 Chief Complaint Document Details Patient Name: Dawn Bowers Date of Service: 02/05/2018 8:00 AM Medical Record Number: [id number] Patient Account Number: [id number] Date of Birth/Sex: 10/13/1986 (31 y.o. Female) Treating RN: Ashok Cordia, Debi Primary Care Provider: PATIENT, NO Other Clinician: Referring Provider: Ronney Lion Treating Provider/Extender: Kathreen Cosier in Treatment: 0 Information Obtained from: Patient Chief Complaint She is here for evaluation of left finger laceration Electronic Signature(s) Signed: 02/05/2018 8:55:12 AM By: Bonnell Public Entered By: Bonnell Public on 02/05/2018 08:55:12 [place], [name] ([id number]) -------------------------------------------------------------------------------- HPI Details Patient Name: Dawn Bowers Date of Service: 02/05/2018 8:00 AM Medical Record Number: [id number] Patient Account Number: [id number] Date of Birth/Sex: August 07, 1986 (31 y.o. Female) Treating RN: Ashok Cordia, Debi Primary Care Provider: PATIENT, NO Other Clinician: Referring Provider: Ronney Lion Treating Provider/Extender: Kathreen Cosier in Treatment: 0 History of Present Illness Location: left second finger Quality: sharp and throbbing Severity: 3-5/10 when cleansed/interrogated Duration: occurred on 02/01/18 Timing: pain is intermittent Context: traumatic injury, cut on glass while washing dishes HPI Description: 02/05/18 She is her for evaluation of a left pointer finger. She was washing a wine glass under a running faucet, not in dirty dishwater, when the glass broke of lacerated her finger. [place] of [place] injury, presented to urgent care on 2/18 where they used silver nitrate for hemostasis. She has been dressing the finger with neosporin since then. There is evidence of residual silver nitrate and the wound bed, does not appear infected. She has extreme anxiety, crying  throughout nurse triage and tearful during my interview and wound evaluation. We will treat with Prisma and follow up next week. She has been encouraged to keep dressing cdi, change if dirty or soiled. We reviewed s/s of infection and to notify us during business hours or report to urgent care for evaluation. Electronic Signature(s) Signed: 02/05/2018 8:55:49 AM By: Bonnell Public Previous Signature: 02/05/2018 8:54:19 AM Version By: Bonnell Public Entered By: Bonnell Public on 02/05/2018 08:55:49 Dawn Bowers ([id number]) -------------------------------------------------------------------------------- Physical Exam Details Patient Name: Dawn Bowers Date of Service: 02/05/2018 8:00 AM Medical Record Number: [id number] Patient Account Number: [id number] Date of Birth/Sex: 1986-02-26 (31 y.o. Female) Treating RN: Ashok Cordia, Debi Primary Care Provider: PATIENT, NO Other Clinician: Referring Provider: Ronney Lion Treating Provider/Extender: Bonnell Public Weeks in Treatment: 0 Respiratory non-labored. clear to auscultation. Cardiovascular s1 s2 regular, rate controlled. Musculoskeletal ambulates without assistance. Neurological no sensory or motor loss to left index finger. Psychiatric does appear to have good insight or judgement to medical needs. oriented x4. high anxiety. Electronic Signature(s) Signed: 02/05/2018 8:57:09 AM By: Bonnell Public Entered By: Bonnell Public on 02/05/2018 08:57:08 Dawn Bowers ([id number]) -------------------------------------------------------------------------------- Physician Orders Details Patient Name: Dawn Bowers Date of Service: 02/05/2018 8:00 AM Medical Record Number: [id number] Patient Account Number: [id number] Date of Birth/Sex: [birth year] (31 y.o. Female) Treating RN: Ashok Cordia, Debi Primary Care Provider: PATIENT, NO Other Clinician: Referring Provider: Ronney Lion Treating Provider/Extender: Kathreen Cosier in Treatment:  0 Verbal / Phone Orders: Yes Clinician: Pinkerton, Debi Read Back and Verified: Yes Diagnosis Coding Wound Cleansing Wound #1 Left Hand - 2nd Digit o Clean wound with Normal Saline. o Cleanse wound with mild soap and water o May Shower, gently pat wound dry prior to applying new dressing. Anesthetic (add to Medication List) Wound #1 Left Hand - 2nd Digit o Topical Lidocaine 4% cream applied to wound bed prior to debridement (In Clinic Only). Primary Wound Dressing Wound #1 Left Hand - 2nd Digit   o Hydrogel o Prisma Ag - moisten with hydrogel Secondary Dressing Wound #1 Left Hand - 2nd Digit o Conform/Kerlix o Non-adherent pad o Other - stretch netting #2 Dressing Change Frequency Wound #1 Left Hand - 2nd Digit o Change dressing every other day. o Other: - as needed Follow-up Appointments Wound #1 Left Hand - 2nd Digit o Return Appointment in 1 week. Additional Orders / Instructions Wound #1 Left Hand - 2nd Digit o Increase protein intake. Patient Medications Allergies: No Known Allergies Notifications Medication Indication Start End lidocaine DOSE 1 - topical 4 % cream - 1 cream topical [name], Dawn Bowers ([id number]) Electronic Signature(s) Signed: 02/05/2018 5:45:09 PM By: Bonnell Public Signed: 02/06/2018 8:00:35 AM By: Alejandro Mulling Entered By: Alejandro Mulling on 02/05/2018 14:21:06 [place], Dawn Bowers ([id number]) -------------------------------------------------------------------------------- Prescription 02/05/2018 Patient Name: Dawn Bowers Provider: Bonnell Public NP Date of Birth: 01-31-1986 NPI#: [id number] Sex: F DEA#: [id number] Phone #: [phone] License #: Patient Address: [facility] [address] [facility] [place], [place] [address] [address] [place], [place] [address] [phone] Allergies No Known  Allergies Medication Medication: Route: Strength: Form: lidocaine 4 % topical cream topical 4% cream Class: TOPICAL LOCAL ANESTHETICS Dose: Frequency / Time: Indication: 1 1 cream topical Number of Refills: Number of Units: 0 Generic Substitution: Start Date: End Date: One Time Use: Substitution Permitted No Note to Pharmacy: Signature(s): Date(s): Electronic Signature(s) Signed: 02/05/2018 5:45:09 PM By: Bonnell Public Signed: 02/06/2018 8:00:35 AM By: Alejandro Mulling Entered By: Alejandro Mulling on 02/05/2018 14:21:07 [place], [name] ([id number]) --------------------------------------------------------------------------------  Problem List Details Patient Name: Dawn Bowers Date of Service: 02/05/2018 8:00 AM Medical Record Number: [id number] Patient Account Number: [id number] Date of Birth/Sex: 04/17/1986 (31 y.o. Female) Treating RN: Ashok Cordia, Debi Primary Care Provider: PATIENT, NO Other Clinician: Referring Provider: Ronney Lion Treating Provider/Extender: Kathreen Cosier in Treatment: 0 Active Problems ICD-10 Encounter Code Description Active Date Diagnosis S61.211S Laceration without foreign body of left index finger without damage 02/05/2018 Yes to nail, sequela Inactive Problems Resolved Problems Electronic Signature(s) Signed: 02/05/2018 8:54:48 AM By: Bonnell Public Entered By: Bonnell Public on 02/05/2018 08:54:48 [place], Aylanie ([id number]) -------------------------------------------------------------------------------- Progress Note Details Patient Name: Dawn Bowers Date of Service: 02/05/2018 8:00 AM Medical Record Number: [id number] Patient Account Number: [id number] Date of Birth/Sex: January 06, 1986 (31 y.o. Female) Treating RN: Ashok Cordia, Debi Primary Care Provider: PATIENT, NO Other Clinician: Referring Provider: Ronney Lion Treating Provider/Extender: Kathreen Cosier in Treatment: 0 Subjective Chief Complaint Information obtained  from Patient She is here for evaluation of left finger laceration History of Present Illness (HPI) The following HPI elements were documented for the patient's wound: Location: left second finger Quality: sharp and throbbing Severity: 3-5/10 when cleansed/interrogated Duration: occurred on 02/01/18 Timing: pain is intermittent Context: traumatic injury, cut on glass while washing dishes 02/05/18 She is her for evaluation of a left pointer finger. She was washing a wine glass under a running faucet, not in dirty dishwater, when the glass broke of lacerated her finger. She was out of state at the time of the injury, presented to urgent care on 2/18 where they used silver nitrate for hemostasis. She has been dressing the finger with neosporin since then. There is evidence of residual silver nitrate and the wound bed, does not appear infected. She has extreme anxiety, crying throughout nurse triage and tearful during my interview and wound evaluation. We will treat with Prisma and follow up next week. She has been encouraged to keep dressing cdi, change if dirty  or soiled. We reviewed s/s of infection and to notify us during business hours or report to urgent care for evaluation. Wound History Patient presents with 1 open wound that has been present for approximately 1 week. Patient has been treating wound in the following manner: neosporin. Laboratory tests have not been performed in the last month. Patient reportedly has not tested positive for an antibiotic resistant organism. Patient reportedly has not tested positive for osteomyelitis. Patient reportedly has not had testing performed to evaluate circulation in the legs. Patient History Information obtained from Patient. Allergies No Known Allergies Family History Diabetes - Maternal Grandparents, Heart Disease - Maternal Grandparents,Paternal Grandparents, Hypertension - Maternal Grandparents,Paternal Grandparents, No family history of  Cancer, Hereditary Spherocytosis, Kidney Disease, Lung Disease, Seizures, Stroke, Thyroid Problems, Tuberculosis. Social History Former smoker, Marital Status - Single, Alcohol Use - Moderate, Drug Use - No History, Caffeine Use - Moderate. Medical History Dawn DingwallCHARLES, Juno ([id number]) Hematologic/Lymphatic Denies history of Anemia, Hemophilia, Human Immunodeficiency Virus, Lymphedema, Sickle Cell Disease Respiratory Denies history of Aspiration, Asthma, Chronic Obstructive Pulmonary Disease (COPD), Pneumothorax, Sleep Apnea, Tuberculosis Cardiovascular Denies history of Angina, Arrhythmia, Congestive Heart Failure, Coronary Artery Disease, Deep Vein Thrombosis, Hypertension, Hypotension, Myocardial Infarction, Peripheral Arterial Disease, Peripheral Venous Disease, Phlebitis, Vasculitis Gastrointestinal Denies history of Cirrhosis , Colitis, Crohn s, Hepatitis A, Hepatitis B, Hepatitis C Endocrine Denies history of Type I Diabetes, Type II Diabetes Immunological Denies history of Lupus Erythematosus, Raynaud s, Scleroderma Musculoskeletal Denies history of Gout, Rheumatoid Arthritis, Osteoarthritis, Osteomyelitis Neurologic Denies history of Dementia, Neuropathy Oncologic Denies history of Received Chemotherapy, Received Radiation Review of Systems (ROS) Constitutional Symptoms (General Health) The patient has no complaints or symptoms. Eyes The patient has no complaints or symptoms. Ear/Nose/Mouth/Throat The patient has no complaints or symptoms. Hematologic/Lymphatic The patient has no complaints or symptoms. Respiratory The patient has no complaints or symptoms. Cardiovascular The patient has no complaints or symptoms. Gastrointestinal The patient has no complaints or symptoms. Endocrine The patient has no complaints or symptoms. Genitourinary The patient has no complaints or symptoms. Immunological The patient has no complaints or symptoms. Integumentary (Skin) The  patient has no complaints or symptoms. Musculoskeletal The patient has no complaints or symptoms. Neurologic The patient has no complaints or symptoms. Oncologic The patient has no complaints or symptoms. Psychiatric Complains or has symptoms of Anxiety. We[place]AR[place], [place] ([id number]) Objective Constitutional Vitals Time Taken: 8:26 AM, Height: 62 in, Source: Measured, Weight: 114 lbs, Source: Measured, BMI: 20.8, Temperature: 98.0 F, Pulse: 88 bpm, Respiratory Rate: 16 breaths/min, Blood Pressure: 144/95 mmHg. Respiratory non-labored. clear to auscultation. Cardiovascular s1 s2 regular, rate controlled. Musculoskeletal ambulates without assistance. Neurological no sensory or motor loss to left index finger. Psychiatric does appear to have good insight or judgement to medical needs. oriented x4. high anxiety. Integumentary (Hair, Skin) Wound #1 status is Open. Original cause of wound was Trauma. The wound is located on the Left Hand - 2nd Digit. The wound measures 0.8cm length x 0.6cm width x 0.2cm depth; 0.377cm^2 area and 0.075cm^3 volume. There is Fat Layer (Subcutaneous Tissue) Exposed exposed. There is no tunneling or undermining noted. There is a large amount of sanguinous drainage noted. The wound margin is flat and intact. There is no granulation within the wound bed. There is a large (67-100%) amount of necrotic tissue within the wound bed including Eschar and Adherent Slough. The periwound skin appearance exhibited: Erythema. The periwound skin appearance did not exhibit: Callus, Crepitus, Excoriation, Induration, Rash, Scarring, Dry/Scaly, Maceration, Atrophie Blanche, Cyanosis, Ecchymosis, Hemosiderin  Staining, Mottled, Pallor, Rubor. The surrounding wound skin color is noted with erythema which is circumferential. Periwound temperature was noted as No Abnormality. The periwound has tenderness on palpation. Assessment Active Problems ICD-10 S61.211S - Laceration  without foreign body of left index finger without damage to nail, sequela Plan Wound Cleansing: Wound #1 Left Hand - 2nd Digit: Clean wound with Normal Saline. Cleanse wound with mild soap and water May Shower, gently pat wound dry prior to applying new dressing. JOYA, WILLMOTT ([id number]) Anesthetic (add to Medication List): Wound #1 Left Hand - 2nd Digit: Topical Lidocaine 4% cream applied to wound bed prior to debridement (In Clinic Only). Primary Wound Dressing: Wound #1 Left Hand - 2nd Digit: Hydrogel Prisma Ag - moisten with hydrogel Secondary Dressing: Wound #1 Left Hand - 2nd Digit: Conform/Kerlix Non-adherent pad Other - stretch netting #2 Dressing Change Frequency: Wound #1 Left Hand - 2nd Digit: Change dressing every other day. Other: - as needed Follow-up Appointments: Wound #1 Left Hand - 2nd Digit: Return Appointment in 1 week. Additional Orders / Instructions: Wound #1 Left Hand - 2nd Digit: Increase protein intake. The following medication(s) was prescribed: lidocaine topical 4 % cream 1 1 cream topical was prescribed at facility 1. prisma tiw 2. follow up next week Electronic Signature(s) Signed: 02/12/2018 10:23:01 AM By: Bonnell Public Previous Signature: 02/05/2018 9:03:53 AM Version By: Bonnell Public Entered By: Bonnell Public on 02/12/2018 10:23:00 [place] ([id number]) -------------------------------------------------------------------------------- ROS/PFSH Details Patient Name: Dawn Bowers Date of Service: 02/05/2018 8:00 AM Medical Record Number: [id number] Patient Account Number: [id number] Date of Birth/Sex: 1986/02/22 (31 y.o. Female) Treating RN: Renne Crigler Primary Care Provider: PATIENT, NO Other Clinician: Referring Provider: Ronney Lion Treating Provider/Extender: Kathreen Cosier in Treatment: 0 Information Obtained From Patient Wound History Do you currently have one or more open woundso Yes How many open wounds do  you currently haveo 1 Approximately how long have you had your woundso 1 week How have you been treating your wound(s) until nowo neosporin Has your wound(s) ever healed and then re-openedo No Have you had any lab work done in the past montho No Have you tested positive for an antibiotic resistant organism (MRSA, VRE)o No Have you tested positive for osteomyelitis (bone infection)o No Have you had any tests for circulation on your legso No Psychiatric Complaints and Symptoms: Positive for: Anxiety Constitutional Symptoms (General Health) Complaints and Symptoms: No Complaints or Symptoms Eyes Complaints and Symptoms: No Complaints or Symptoms Ear/Nose/Mouth/Throat Complaints and Symptoms: No Complaints or Symptoms Hematologic/Lymphatic Complaints and Symptoms: No Complaints or Symptoms Medical History: Negative for: Anemia; Hemophilia; Human Immunodeficiency Virus; Lymphedema; Sickle Cell Disease Respiratory Complaints and Symptoms: No Complaints or Symptoms Medical History: Negative for: Aspiration; Asthma; Chronic Obstructive Pulmonary Disease (COPD); Pneumothorax; Sleep Apnea; Tuberculosis [place], Daissy ([id number]) Cardiovascular Complaints and Symptoms: No Complaints or Symptoms Medical History: Negative for: Angina; Arrhythmia; Congestive Heart Failure; Coronary Artery Disease; Deep Vein Thrombosis; Hypertension; Hypotension; Myocardial Infarction; Peripheral Arterial Disease; Peripheral Venous Disease; Phlebitis; Vasculitis Gastrointestinal Complaints and Symptoms: No Complaints or Symptoms Medical History: Negative for: Cirrhosis ; Colitis; Crohnos; Hepatitis A; Hepatitis B; Hepatitis C Endocrine Complaints and Symptoms: No Complaints or Symptoms Medical History: Negative for: Type I Diabetes; Type II Diabetes Genitourinary Complaints and Symptoms: No Complaints or Symptoms Immunological Complaints and Symptoms: No Complaints or Symptoms Medical  History: Negative for: Lupus Erythematosus; Raynaudos; Scleroderma Integumentary (Skin) Complaints and Symptoms: No Complaints or Symptoms Musculoskeletal Complaints and Symptoms: No Complaints or Symptoms Medical History: Negative for: Gout; Rheumatoid  Arthritis; Osteoarthritis; Osteomyelitis Neurologic Complaints and Symptoms: No Complaints or Symptoms Medical History: Negative for: Dementia; Neuropathy TEAIRA, CROFT ([id number]) Oncologic Complaints and Symptoms: No Complaints or Symptoms Medical History: Negative for: Received Chemotherapy; Received Radiation Immunizations Pneumococcal Vaccine: Received Pneumococcal Vaccination: No Immunization Notes: up to date Implantable Devices Family and Social History Cancer: No; Diabetes: Yes - Maternal Grandparents; Heart Disease: Yes - Maternal Grandparents,Paternal Grandparents; Hereditary Spherocytosis: No; Hypertension: Yes - Maternal Grandparents,Paternal Grandparents; Kidney Disease: No; Lung Disease: No; Seizures: No; Stroke: No; Thyroid Problems: No; Tuberculosis: No; Former smoker; Marital Status - Single; Alcohol Use: Moderate; Drug Use: No History; Caffeine Use: Moderate; Financial Concerns: No; Food, Clothing or Shelter Needs: No; Support System Lacking: No; Transportation Concerns: No; Advanced Directives: No; Patient does not want information on Advanced Directives Electronic Signature(s) Signed: 02/05/2018 5:45:09 PM By: Bonnell Public Signed: 02/06/2018 4:58:29 PM By: Renne Crigler Entered By: Renne Crigler on 02/05/2018 08:33:02 SHEILAH, RAYOS ([id number]) -------------------------------------------------------------------------------- SuperBill Details Patient Name: Dawn Bowers Date of Service: 02/05/2018 Medical Record Number: [id number] Patient Account Number: [id number] Date of Birth/Sex: 10-Jun-1986 (31 y.o. Female) Treating RN: Ashok Cordia, Debi Primary Care Provider: PATIENT, NO Other  Clinician: Referring Provider: Ronney Lion Treating Provider/Extender: Kathreen Cosier in Treatment: 0 Diagnosis Coding ICD-10 Codes Code Description S61.211S Laceration without foreign body of left index finger without damage to nail, sequela Facility Procedures CPT4 Code: 08657846 Description: 99213 - WOUND CARE VISIT-LEV 3 EST PT Modifier: Quantity: 1 Physician Procedures CPT4 Code Description: [id number] WC PHYS LEVEL 3 o NEW PT ICD-10 Diagnosis Description S61.211S Laceration without foreign body of left index finger without Modifier: damage to nail, se Quantity: 1 quela Electronic Signature(s) Signed: 02/05/2018 5:12:27 PM By: Alejandro Mulling Signed: 02/05/2018 5:45:09 PM By: Bonnell Public Previous Signature: 02/05/2018 9:04:08 AM Version By: Bonnell Public Entered By: Alejandro Mulling on 02/05/2018 17:12:27

## 2018-02-12 ENCOUNTER — Encounter: Payer: 59 | Admitting: Nurse Practitioner

## 2018-02-12 DIAGNOSIS — S61211A Laceration without foreign body of left index finger without damage to nail, initial encounter: Secondary | ICD-10-CM | POA: Diagnosis not present

## 2018-02-15 NOTE — Progress Notes (Signed)
Dawn DingwallCHARLES, Araminta ([id number]) Visit Report for 02/12/2018 Arrival Information Details Patient Name: Dawn DingwallCHARLES, Aanya Date of Service: 02/12/2018 10:00 AM Medical Record Number: [id number] Patient Account Number: [id number] Date of Birth/Sex: 10/06/1986 (32 y.o. Female) Treating RN: Curtis Sitesorthy, Joanna Primary Care Rojean Ige: PATIENT, NO Other Clinician: Referring Dalasia Predmore: Ronney LionHAZY, MARIAN Treating Dylana Shaw/Extender: Kathreen Cosieroulter, Leah Weeks in Treatment: 1 Visit Information History Since Last Visit Added or deleted any medications: No Patient Arrived: Ambulatory Any new allergies or adverse reactions: No Arrival Time: 10:04 Had a fall or experienced change in No Accompanied By: self activities of daily living that may affect Transfer Assistance: None risk of falls: Patient Identification Verified: Yes Signs or symptoms of abuse/neglect since last visito No Secondary Verification Process Completed: Yes Hospitalized since last visit: No Has Dressing in Place as Prescribed: Yes Pain Present Now: No Electronic Signature(s) Signed: 02/12/2018 4:10:15 PM By: Curtis Sitesorthy, Joanna Entered By: Curtis Sitesorthy, Joanna on 02/12/2018 10:05:48 [place], Oren SectionACACIA ([id number]) -------------------------------------------------------------------------------- Encounter Discharge Information Details Patient Name: Dawn DingwallHARLES, Ival Date of Service: 02/12/2018 10:00 AM Medical Record Number: [id number] Patient Account Number: [id number] Date of Birth/Sex: 10/06/1986 (32 y.o. Female) Treating RN: Ashok CordiaPinkerton, Debi Primary Care Franklin Clapsaddle: PATIENT, NO Other Clinician: Referring Joella Saefong: Ronney LionHAZY, MARIAN Treating Ramona Slinger/Extender: Kathreen Cosieroulter, Leah Weeks in Treatment: 1 Encounter Discharge Information Items Discharge Pain Level: 0 Discharge Condition: Stable Ambulatory Status: Ambulatory Discharge Destination: Home Transportation: Private Auto Accompanied By: self Schedule Follow-up Appointment: Yes Medication Reconciliation completed  and No provided to Patient/Care Elin Seats: Provided on Clinical Summary of Care: 02/12/2018 Form Type [facility]ip[facility] Signature(s) Signed: 02/13/2018 5:56:09 PM By: Renne CriglerFlinchum, Cheryl Entered By: Renne CriglerFlinchum, Cheryl on 02/12/2018 10:32:59 [place] Rock[place]AR[place], Oren SectionACACIA ([id number]) -------------------------------------------------------------------------------- Lower Extremity Assessment Details Patient Name: Dawn DingwallHARLES, Crysta Date of Service: 02/12/2018 10:00 AM Medical Record Number: [id number] Patient Account Number: [id number] Date of Birth/Sex: 10/06/1986 (32 y.o. Female) Treating RN: Curtis Sitesorthy, Joanna Primary Care Melaya Hoselton: PATIENT, NO Other Clinician: Referring Nain Rudd: Ronney LionHAZY, MARIAN Treating Kennard Fildes/Extender: Kathreen Cosieroulter, Leah Weeks in Treatment: 1 Electronic Signature(s) Signed: 02/12/2018 4:10:15 PM By: Curtis Sitesorthy, Joanna Entered By: Curtis Sitesorthy, Joanna on 02/12/2018 10:14:00 [place], Oren SectionACACIA ([id number]) -------------------------------------------------------------------------------- Multi Wound Chart Details Patient Name: Dawn DingwallHARLES, Stephanee Date of Service: 02/12/2018 10:00 AM Medical Record Number: [id number] Patient Account Number: [id number] Date of Birth/Sex: 10/06/1986 (32 y.o. Female) Treating RN: Ashok CordiaPinkerton, Debi Primary Care Shephanie Romas: PATIENT, NO Other Clinician: Referring Syrina Wake: Ronney LionHAZY, MARIAN Treating Debborah Alonge/Extender: Kathreen Cosieroulter, Leah Weeks in Treatment: 1 Vital Signs Height(in): 62 Pulse(bpm): 78 Weight(lbs): 114 Blood Pressure(mmHg): 110/56 Body Mass Index(BMI): 21 Temperature(F): 98.3 Respiratory Rate 16 (breaths/min): Photos: [N/A:N/A] Wound Location: Left Hand - 2nd Digit N/A N/A Wounding Event: Trauma N/A N/A Primary Etiology: Trauma, Other N/A N/A Date Acquired: 02/01/2018 N/A N/A Weeks of Treatment: 1 N/A N/A Wound Status: Open N/A N/A Measurements L x W x D 0.6x0.2x0.2 N/A N/A (cm) Area (cm) : 0.094 N/A N/A Volume (cm) : 0.019 N/A N/A % Reduction in  Area: 75.10% N/A N/A % Reduction in Volume: 74.70% N/A N/A Classification: Full Thickness Without N/A N/A Exposed Support Structures Exudate Amount: Large N/A N/A Exudate Type: Sanguinous N/A N/A Exudate Color: red N/A N/A Wound Margin: Flat and Intact N/A N/A Granulation Amount: Large (67-100%) N/A N/A Granulation Quality: Red N/A N/A Necrotic Amount: Small (1-33%) N/A N/A Exposed Structures: Fat Layer (Subcutaneous N/A N/A Tissue) Exposed: Yes Fascia: No Tendon: No Muscle: No Joint: No Bone: No Epithelialization: None N/A N/A Leonette MostCHARLES, Quincey ([id number]) Debridement: Debridement ([id number]) N/A N/A Pre-procedure 10:18 N/A N/A Verification/Time Out Taken: Pain Control: Lidocaine 4% Topical Solution N/A N/A Tissue Debrided:  Fibrin/Slough, Exudates, N/A N/A Subcutaneous Level: Skin/Subcutaneous Tissue N/A N/A Debridement Area (sq cm): 0.12 N/A N/A Instrument: Curette N/A N/A Bleeding: Minimum N/A N/A Hemostasis Achieved: Pressure N/A N/A Procedural Pain: 0 N/A N/A Post Procedural Pain: 0 N/A N/A Debridement Treatment Procedure was tolerated well N/A N/A Response: Post Debridement 0.6x0.2x0.3 N/A N/A Measurements L x W x D (cm) Post Debridement Volume: 0.028 N/A N/A (cm) Periwound Skin Texture: Excoriation: No N/A N/A Induration: No Callus: No Crepitus: No Rash: No Scarring: No Periwound Skin Moisture: Maceration: No N/A N/A Dry/Scaly: No Periwound Skin Color: Erythema: Yes N/A N/A Atrophie Blanche: No Cyanosis: No Ecchymosis: No Hemosiderin Staining: No Mottled: No Pallor: No Rubor: No Erythema Location: Circumferential N/A N/A Temperature: No Abnormality N/A N/A Tenderness on Palpation: Yes N/A N/A Wound Preparation: Ulcer Cleansing: N/A N/A Rinsed/Irrigated with Saline Topical Anesthetic Applied: Other: lidocaine 4% Procedures Performed: Debridement N/A N/A Treatment Notes Wound #1 (Left Hand - 2nd Digit) 1. Cleansed with: Clean wound with  Normal Saline 2. Anesthetic Topical Lidocaine 4% cream to wound bed prior to debridement 4. Dressing Applied: Hydrogel Prisma Ag 5. Secondary Dressing Applied [place], Dyanara ([id number]) Dry Gauze Notes coban wrap with tape. cover with tubi grip to hold in place Electronic Signature(s) Signed: 02/12/2018 12:05:20 PM By: Bonnell Public Previous Signature: 02/12/2018 11:16:25 AM Version By: Bonnell Public Entered By: Bonnell Public on 02/12/2018 12:05:19 MAHIRA, PETRAS ([id number]) -------------------------------------------------------------------------------- Multi-Disciplinary Care Plan Details Patient Name: Dawn Bowers Date of Service: 02/12/2018 10:00 AM Medical Record Number: [id number] Patient Account Number: [id number] Date of Birth/Sex: [birth year] (31 y.o. Female) Treating RN: Ashok Cordia, Debi Primary Care Adrienne Delay: PATIENT, NO Other Clinician: Referring Kasmira Cacioppo: Ronney Lion Treating Tryone Kille/Extender: Kathreen Cosier in Treatment: 1 Active Inactive ` Orientation to the Wound Care Program Nursing Diagnoses: Knowledge deficit related to the wound healing center program Goals: Patient/caregiver will verbalize understanding of the Wound Healing Center Program Date Initiated: 02/05/2018 Target Resolution Date: 02/21/2018 Goal Status: Active Interventions: Provide education on orientation to the wound center Notes: ` Pain, Acute or Chronic Nursing Diagnoses: Pain, acute or chronic: actual or potential Potential alteration in comfort, pain Goals: Patient/caregiver will verbalize adequate pain control between visits Date Initiated: 02/05/2018 Target Resolution Date: 05/23/2018 Goal Status: Active Interventions: Complete pain assessment as per visit requirements Notes: ` Wound/Skin Impairment Nursing Diagnoses: Impaired tissue integrity Knowledge deficit related to ulceration/compromised skin integrity Goals: Ulcer/skin breakdown will have a volume reduction of  80% by week 12 Date Initiated: 02/05/2018 Target Resolution Date: 04/18/2018 Goal Status: Active [place], JLEIGH ([id number]) Interventions: Assess ulceration(s) every visit Notes: Electronic Signature(s) Signed: 02/12/2018 4:59:29 PM By: Alejandro Mulling Entered By: Alejandro Mulling on 02/12/2018 10:18:20 [place], [name] ([id number]) -------------------------------------------------------------------------------- Pain Assessment Details Patient Name: Dawn Bowers Date of Service: 02/12/2018 10:00 AM Medical Record Number: [id number] Patient Account Number: [id number] Date of Birth/Sex: [birth year] (31 y.o. Female) Treating RN: Curtis Sites Primary Care Delcie Ruppert: PATIENT, NO Other Clinician: Referring Obi Scrima: Ronney Lion Treating Demara Lover/Extender: Kathreen Cosier in Treatment: 1 Active Problems Location of Pain Severity and Description of Pain Patient Has Paino No Site Locations Pain Management and Medication Current Pain Management: Notes Topical or injectable lidocaine is offered to patient for acute pain when surgical debridement is performed. If needed, Patient is instructed to use over the counter pain medication for the following 24-48 hours after debridement. Wound care MDs do not prescribed pain medications. Patient has chronic pain or uncontrolled pain. Patient has been instructed to make an appointment with their Primary Care Physician for pain management.  Electronic Signature(s) Signed: 02/12/2018 4:10:15 PM By: Curtis Sites Entered By: Curtis Sites on 02/12/2018 10:05:57 [place] ([id number]) -------------------------------------------------------------------------------- Patient/Caregiver Education Details Patient Name: Dawn Bowers Date of Service: 02/12/2018 10:00 AM Medical Record Number: [id number] Patient Account Number: [id number] Date of Birth/Gender: 1986-08-22 (31 y.o. Female) Treating RN: Renne Crigler Primary Care Physician:  PATIENT, NO Other Clinician: Referring Physician: Ronney Lion Treating Physician/Extender: Kathreen Cosier in Treatment: 1 Education Assessment Education Provided To: Patient Education Topics Provided Wound/Skin Impairment: Handouts: Caring for Your Ulcer Methods: Explain/Verbal Responses: State content correctly Electronic Signature(s) Signed: 02/13/2018 5:56:09 PM By: Renne Crigler Entered By: Renne Crigler on 02/12/2018 10:34:02 [place], [name] ([id number]) -------------------------------------------------------------------------------- Wound Assessment Details Patient Name: Dawn Bowers Date of Service: 02/12/2018 10:00 AM Medical Record Number: [id number] Patient Account Number: [id number] Date of Birth/Sex: 1986/12/02 (31 y.o. Female) Treating RN: Curtis Sites Primary Care Bobetta Korf: PATIENT, NO Other Clinician: Referring Delmos Velaquez: Ronney Lion Treating Dayshaun Whobrey/Extender: Kathreen Cosier in Treatment: 1 Wound Status Wound Number: 1 Primary Etiology: Trauma, Other Wound Location: Left Hand - 2nd Digit Wound Status: Open Wounding Event: Trauma Date Acquired: 02/01/2018 Weeks Of Treatment: 1 Clustered Wound: No Photos Photo Uploaded By: Curtis Sites on 02/12/2018 11:32:35 Wound Measurements Length: (cm) 0.6 Width: (cm) 0.2 Depth: (cm) 0.2 Area: (cm) 0.094 Volume: (cm) 0.019 % Reduction in Area: 75.1% % Reduction in Volume: 74.7% Epithelialization: None Tunneling: No Undermining: No Wound Description Full Thickness Without Exposed Support Classification: Structures Wound Margin: Flat and Intact Exudate Large Amount: Exudate Type: Sanguinous Exudate Color: red Foul Odor After Cleansing: No Slough/Fibrino Yes Wound Bed Granulation Amount: Large (67-100%) Exposed Structure Granulation Quality: Red Fascia Exposed: No Necrotic Amount: Small (1-33%) Fat Layer (Subcutaneous Tissue) Exposed: Yes Necrotic Quality: Adherent Slough Tendon  Exposed: No Muscle Exposed: No Joint Exposed: No Bone Exposed: No [place], Eris ([id number]) Periwound Skin Texture Texture Color No Abnormalities Noted: No No Abnormalities Noted: No Callus: No Atrophie Blanche: No Crepitus: No Cyanosis: No Excoriation: No Ecchymosis: No Induration: No Erythema: Yes Rash: No Erythema Location: Circumferential Scarring: No Hemosiderin Staining: No Mottled: No Moisture Pallor: No No Abnormalities Noted: No Rubor: No Dry / Scaly: No Maceration: No Temperature / Pain Temperature: No Abnormality Tenderness on Palpation: Yes Wound Preparation Ulcer Cleansing: Rinsed/Irrigated with Saline Topical Anesthetic Applied: Other: lidocaine 4%, Treatment Notes Wound #1 (Left Hand - 2nd Digit) 1. Cleansed with: Clean wound with Normal Saline 2. Anesthetic Topical Lidocaine 4% cream to wound bed prior to debridement 4. Dressing Applied: Hydrogel Prisma Ag 5. Secondary Dressing Applied Dry Gauze Notes coban wrap with tape. cover with tubi grip to hold in place Electronic Signature(s) Signed: 02/12/2018 4:10:15 PM By: Curtis Sites Entered By: Curtis Sites on 02/12/2018 10:13:51 BELLARAE, LIZER ([id number]) -------------------------------------------------------------------------------- Vitals Details Patient Name: Dawn Bowers Date of Service: 02/12/2018 10:00 AM Medical Record Number: [id number] Patient Account Number: [id number] Date of Birth/Sex: Jul 22, 1986 (31 y.o. Female) Treating RN: Curtis Sites Primary Care Shields Pautz: PATIENT, NO Other Clinician: Referring Carliyah Cotterman: Ronney Lion Treating Shaindel Sweeten/Extender: Kathreen Cosier in Treatment: 1 Vital Signs Time Taken: 10:07 Temperature (F): 98.3 Height (in): 62 Pulse (bpm): 78 Weight (lbs): 114 Respiratory Rate (breaths/min): 16 Body Mass Index (BMI): 20.8 Blood Pressure (mmHg): 110/56 Reference Range: 80 - 120 mg / dl Electronic Signature(s) Signed: 02/12/2018  4:10:15 PM By: Curtis Sites Entered By: Curtis Sites on 02/12/2018 10:08:05

## 2018-02-19 ENCOUNTER — Encounter: Payer: 59 | Attending: Nurse Practitioner | Admitting: Nurse Practitioner

## 2018-02-19 DIAGNOSIS — F419 Anxiety disorder, unspecified: Secondary | ICD-10-CM | POA: Insufficient documentation

## 2018-02-19 DIAGNOSIS — S61211A Laceration without foreign body of left index finger without damage to nail, initial encounter: Secondary | ICD-10-CM | POA: Diagnosis present

## 2018-02-19 DIAGNOSIS — Z8249 Family history of ischemic heart disease and other diseases of the circulatory system: Secondary | ICD-10-CM | POA: Diagnosis not present

## 2018-02-19 DIAGNOSIS — Z833 Family history of diabetes mellitus: Secondary | ICD-10-CM | POA: Diagnosis not present

## 2018-02-19 DIAGNOSIS — W25XXXA Contact with sharp glass, initial encounter: Secondary | ICD-10-CM | POA: Insufficient documentation

## 2018-02-19 DIAGNOSIS — Z87891 Personal history of nicotine dependence: Secondary | ICD-10-CM | POA: Diagnosis not present

## 2018-02-19 DIAGNOSIS — Y93G1 Activity, food preparation and clean up: Secondary | ICD-10-CM | POA: Diagnosis not present

## 2018-02-20 NOTE — Progress Notes (Signed)
Dawn Bowers ([id number]) Visit Report for 02/19/2018 Chief Complaint Document Details Patient Name: Dawn Bowers, Dawn Bowers Date of Service: 02/19/2018 9:30 AM Medical Record Number: [id number] Patient Account Number: [id number] Date of Birth/Sex: [birth year] (32 y.o. Female) Treating RN: Ashok Cordia, Debi Primary Care Provider: PATIENT, NO Other Clinician: Referring Provider: Ronney Lion Treating Provider/Extender: Kathreen Cosier in Treatment: 2 Information Obtained from: Patient Chief Complaint She is here for evaluation of left finger laceration Electronic Signature(s) Signed: 02/19/2018 10:09:53 AM By: Bonnell Public Entered By: Bonnell Public on 02/19/2018 10:09:52 [place], [name] ([id number]) -------------------------------------------------------------------------------- Debridement Details Patient Name: Dawn Bowers Date of Service: 02/19/2018 9:30 AM Medical Record Number: [id number] Patient Account Number: [id number] Date of Birth/Sex: 1986-04-05 (32 y.o. Female) Treating RN: Ashok Cordia, Debi Primary Care Provider: PATIENT, NO Other Clinician: Referring Provider: Ronney Lion Treating Provider/Extender: Kathreen Cosier in Treatment: 2 Debridement Performed for Wound #1 Left Hand - 2nd Digit Assessment: Performed By: Physician Bonnell Public, NP Debridement: Debridement Pre-procedure Verification/Time Yes - 10:03 Out Taken: Start Time: 10:04 Pain Control: Lidocaine 4% Topical Solution Level: Skin/Subcutaneous Tissue Total Area Debrided (L x W): 0.9 (cm) x 0.3 (cm) = 0.27 (cm) Tissue and other material Viable, Non-Viable, Exudate, Skin, Subcutaneous debrided: Instrument: Blade Bleeding: Minimum Hemostasis Achieved: Pressure End Time: 10:07 Procedural Pain: 0 Post Procedural Pain: 0 Response to Treatment: Procedure was tolerated well Post Debridement Measurements of Total Wound Length: (cm) 0.9 Width: (cm) 0.3 Depth: (cm) 0.2 Volume: (cm) 0.042 Character of  Wound/Ulcer Post Debridement: Requires Further Debridement Post Procedure Diagnosis Same as Pre-procedure Electronic Signature(s) Signed: 02/19/2018 10:09:45 AM By: Bonnell Public Signed: 02/19/2018 4:33:49 PM By: Alejandro Mulling Entered By: Bonnell Public on 02/19/2018 10:09:45 [place], [name] ([id number]) -------------------------------------------------------------------------------- HPI Details Patient Name: Dawn Bowers Date of Service: 02/19/2018 9:30 AM Medical Record Number: [id number] Patient Account Number: [id number] Date of Birth/Sex: 31-May-1986 (32 y.o. Female) Treating RN: Ashok Cordia, Debi Primary Care Provider: PATIENT, NO Other Clinician: Referring Provider: Ronney Lion Treating Provider/Extender: Kathreen Cosier in Treatment: 2 History of Present Illness Location: left second finger Quality: sharp and throbbing Severity: 3-5/10 when cleansed/interrogated Duration: occurred on 02/01/18 Timing: pain is intermittent Context: traumatic injury, cut on glass while washing dishes HPI Description: 02/05/18 She is her for evaluation of a left pointer finger. She was washing a wine glass under a running faucet, not in dirty dishwater, when the glass broke of lacerated her finger. [place] of [place] injury, presented to urgent care on 2/18 where they used silver nitrate for hemostasis. She has been dressing the finger with neosporin since then. There is evidence of residual silver nitrate and the wound bed, does not appear infected. She has extreme anxiety, crying throughout nurse triage and tearful during my interview and wound evaluation. We will treat with Prisma and follow up next week. She has been encouraged to keep dressing cdi, change if dirty or soiled. We reviewed s/s of infection and to notify us during business hours or report to urgent care for evaluation. 02/12/18 she is here in follow-up evaluation for a left pointer finger laceration. There is  improvement in the wound with a small amount of residual silver nitrate. She is tolerating prisma dressings with hydrogel. She'll follow-up next week 02/19/18-she is here in follow-up evaluation. She continues to use Prisma. There is improvement in the wound, no residual silver nitrate identified. She will follow-up next week Electronic Signature(s) Signed: 02/19/2018 10:10:50 AM By: Bonnell Public Entered By: Bonnell Public on 02/19/2018 10:10:50 Dawn Bowers, Dawn Bowers ([id number]) --------------------------------------------------------------------------------  Physician Orders Details Patient Name: Dawn Bowers, Dawn Bowers Date of Service: 02/19/2018 9:30 AM Medical Record Number: [id number] Patient Account Number: [id number] Date of Birth/Sex: 1986/07/09 (32 y.o. Female) Treating RN: Ashok Cordia, Debi Primary Care Provider: PATIENT, NO Other Clinician: Referring Provider: Ronney Lion Treating Provider/Extender: Kathreen Cosier in Treatment: 2 Verbal / Phone Orders: Yes Clinician: Pinkerton, Debi Read Back and Verified: Yes Diagnosis Coding Wound Cleansing Wound #1 Left Hand - 2nd Digit o Clean wound with Normal Saline. o Cleanse wound with mild soap and water o May Shower, gently pat wound dry prior to applying new dressing. Anesthetic (add to Medication List) Wound #1 Left Hand - 2nd Digit o Topical Lidocaine 4% cream applied to wound bed prior to debridement (In Clinic Only). Primary Wound Dressing Wound #1 Left Hand - 2nd Digit o Hydrogel o Prisma Ag - moisten with hydrogel Secondary Dressing Wound #1 Left Hand - 2nd Digit o Dry Gauze o Conform/Kerlix o Other - stretch netting #2 Dressing Change Frequency Wound #1 Left Hand - 2nd Digit o Change dressing every other day. o Other: - as needed Follow-up Appointments Wound #1 Left Hand - 2nd Digit o Return Appointment in 1 week. Additional Orders / Instructions Wound #1 Left Hand - 2nd Digit o Increase protein  intake. Patient Medications Allergies: No Known Allergies Notifications Medication Indication Start End lidocaine DOSE 1 - topical 4 % cream - 1 cream topical Dawn Bowers ([id number]) Electronic Signature(s) Signed: 02/19/2018 4:33:49 PM By: Alejandro Mulling Signed: 02/19/2018 5:22:04 PM By: Bonnell Public Entered By: Alejandro Mulling on 02/19/2018 10:08:53 [place], [place] ([id number]) -------------------------------------------------------------------------------- Prescription 02/19/2018 Patient Name: Dawn Bowers Provider: Bonnell Public NP Date of Birth: [birth year] NPI#: [id number] Sex: F DEA#: [id number] Phone #: [phone] License #: Patient Address: [facility] [address] [facility] [place], [place] [address] [address] [place], [place] [address] [phone] Allergies No Known Allergies Medication Medication: Route: Strength: Form: lidocaine topical 4% cream Class: TOPICAL LOCAL ANESTHETICS Dose: Frequency / Time: Indication: 1 1 cream topical Number of Refills: Number of Units: 0 Generic Substitution: Start Date: End Date: Administered at Substitution Permitted Facility: Yes Time Administered: Time Discontinued: Note to Pharmacy: Signature(s): Date(s): Electronic Signature(s) Signed: 02/19/2018 4:33:49 PM By: Alejandro Mulling Signed: 02/19/2018 5:22:04 PM By: Bonnell Public Entered By: Alejandro Mulling on 02/19/2018 10:08:53 [place], Lorriane ([id number]) [place], [place] ([id number]) --------------------------------------------------------------------------------  Problem List Details Patient Name: Dawn Bowers Date of Service: 02/19/2018 9:30 AM Medical Record Number: [id number] Patient Account Number: [id number] Date of Birth/Sex: 1986/10/20 (32 y.o. Female) Treating RN: Ashok Cordia, Debi Primary Care Provider: PATIENT, NO Other Clinician: Referring Provider: Ronney Lion Treating  Provider/Extender: Kathreen Cosier in Treatment: 2 Active Problems ICD-10 Encounter Code Description Active Date Diagnosis S61.211S Laceration without foreign body of left index finger without damage 02/05/2018 Yes to nail, sequela Inactive Problems Resolved Problems Electronic Signature(s) Signed: 02/19/2018 10:09:17 AM By: Bonnell Public Entered By: Bonnell Public on 02/19/2018 10:09:17 [place], [name] ([id number]) -------------------------------------------------------------------------------- Progress Note Details Patient Name: Dawn Bowers Date of Service: 02/19/2018 9:30 AM Medical Record Number: [id number] Patient Account Number: [id number] Date of Birth/Sex: [birth year] (32 y.o. Female) Treating RN: Ashok Cordia, Debi Primary Care Provider: PATIENT, NO Other Clinician: Referring Provider: Ronney Lion Treating Provider/Extender: Kathreen Cosier in Treatment: 2 Subjective Chief Complaint Information obtained from Patient She is here for evaluation of left finger laceration History of Present Illness (HPI) The following HPI elements were documented for the patient's wound: Location: left second finger Quality: sharp and  throbbing Severity: 3-5/10 when cleansed/interrogated Duration: occurred on 02/01/18 Timing: pain is intermittent Context: traumatic injury, cut on glass while washing dishes 02/05/18 She is her for evaluation of a left pointer finger. She was washing a wine glass under a running faucet, not in dirty dishwater, when the glass broke of lacerated her finger. [place] was out of [place] at the time of the injury, presented to urgent care on 2/18 where they used silver nitrate for hemostasis. She has been dressing the finger with neosporin since then. There is evidence of residual silver nitrate and the wound bed, does not appear infected. She has extreme anxiety, crying throughout nurse triage and tearful during my interview and wound evaluation. We will treat with  Prisma and follow up next week. She has been encouraged to keep dressing cdi, change if dirty or soiled. We reviewed s/s of infection and to notify us during business hours or report to urgent care for evaluation. 02/12/18 she is here in follow-up evaluation for a left pointer finger laceration. There is improvement in the wound with a small amount of residual silver nitrate. She is tolerating prisma dressings with hydrogel. She'll follow-up next week 02/19/18-she is here in follow-up evaluation. She continues to use Prisma. There is improvement in the wound, no residual silver nitrate identified. She will follow-up next week Patient History Information obtained from Patient. Family History Diabetes - Maternal Grandparents, Heart Disease - Maternal Grandparents,Paternal Grandparents, Hypertension - Maternal Grandparents,Paternal Grandparents, No family history of Cancer, Hereditary Spherocytosis, Kidney Disease, Lung Disease, Seizures, Stroke, Thyroid Problems, Tuberculosis. Social History Former smoker, Marital Status - Single, Alcohol Use - Moderate, Drug Use - No History, Caffeine Use - Moderate. [place], [place] ([id number]) Objective Constitutional Vitals Time Taken: 9:43 AM, Height: 62 in, Weight: 114 lbs, BMI: 20.8, Temperature: 98.1 F, Pulse: 73 bpm, Respiratory Rate: 16 breaths/min, Blood Pressure: 113/79 mmHg. Integumentary (Hair, Skin) Wound #1 status is Open. Original cause of wound was Trauma. The wound is located on the Left Hand - 2nd Digit. The wound measures 0.9cm length x 0.3cm width x 0.1cm depth; 0.212cm^2 area and 0.021cm^3 volume. There is Fat Layer (Subcutaneous Tissue) Exposed exposed. There is no undermining noted, however, there is tunneling at 12:00 with a maximum distance of 0.3cm. There is a large amount of sanguinous drainage noted. The wound margin is flat and intact. There is large (67-100%) red granulation within the wound bed. There is a small (1-33%) amount  of necrotic tissue within the wound bed including Adherent Slough. The periwound skin appearance exhibited: Erythema. The periwound skin appearance did not exhibit: Callus, Crepitus, Excoriation, Induration, Rash, Scarring, Dry/Scaly, Maceration, Atrophie Blanche, Cyanosis, Ecchymosis, Hemosiderin Staining, Mottled, Pallor, Rubor. The surrounding wound skin color is noted with erythema which is circumferential. Periwound temperature was noted as No Abnormality. The periwound has tenderness on palpation. Assessment Active Problems ICD-10 S61.211S - Laceration without foreign body of left index finger without damage to nail, sequela Procedures Wound #1 Pre-procedure diagnosis of Wound #1 is a Trauma, Other located on the Left Hand - 2nd Digit . There was a Skin/Subcutaneous Tissue Debridement ([id number]) debridement with total area of 0.27 sq cm performed by Bonnell Public, NP. with the following instrument(s): Blade to remove Viable and Non-Viable tissue/material including Exudate, Skin, and Subcutaneous after achieving pain control using Lidocaine 4% Topical Solution. A time out was conducted at 10:03, prior to the start of the procedure. A Minimum amount of bleeding was controlled with Pressure. The procedure was tolerated well with a pain  level of 0 throughout and a pain level of 0 following the procedure. Post Debridement Measurements: 0.9cm length x 0.3cm width x 0.2cm depth; 0.042cm^3 volume. Character of Wound/Ulcer Post Debridement requires further debridement. Post procedure Diagnosis Wound #1: Same as Pre-Procedure Plan Wound Cleansing: Dawn Bowers, Dawn Bowers ([id number]) Wound #1 Left Hand - 2nd Digit: Clean wound with Normal Saline. Cleanse wound with mild soap and water May Shower, gently pat wound dry prior to applying new dressing. Anesthetic (add to Medication List): Wound #1 Left Hand - 2nd Digit: Topical Lidocaine 4% cream applied to wound bed prior to debridement (In Clinic  Only). Primary Wound Dressing: Wound #1 Left Hand - 2nd Digit: Hydrogel Prisma Ag - moisten with hydrogel Secondary Dressing: Wound #1 Left Hand - 2nd Digit: Dry Gauze Conform/Kerlix Other - stretch netting #2 Dressing Change Frequency: Wound #1 Left Hand - 2nd Digit: Change dressing every other day. Other: - as needed Follow-up Appointments: Wound #1 Left Hand - 2nd Digit: Return Appointment in 1 week. Additional Orders / Instructions: Wound #1 Left Hand - 2nd Digit: Increase protein intake. The following medication(s) was prescribed: lidocaine topical 4 % cream 1 1 cream topical was prescribed at facility 1. Prisma 2. Follow-up next week Electronic Signature(s) Signed: 02/19/2018 10:11:20 AM By: Bonnell Public Entered By: Bonnell Public on 02/19/2018 10:11:20 Dawn Bowers, Dawn Bowers ([id number]) -------------------------------------------------------------------------------- ROS/PFSH Details Patient Name: Dawn Bowers Date of Service: 02/19/2018 9:30 AM Medical Record Number: [id number] Patient Account Number: [id number] Date of Birth/Sex: [birth year] (32 y.o. Female) Treating RN: Ashok Cordia, Debi Primary Care Provider: PATIENT, NO Other Clinician: Referring Provider: Ronney Lion Treating Provider/Extender: Kathreen Cosier in Treatment: 2 Information Obtained From Patient Wound History Do you currently have one or more open woundso Yes How many open wounds do you currently haveo 1 Approximately how long have you had your woundso 1 week How have you been treating your wound(s) until nowo neosporin Has your wound(s) ever healed and then re-openedo No Have you had any lab work done in the past montho No Have you tested positive for an antibiotic resistant organism (MRSA, VRE)o No Have you tested positive for osteomyelitis (bone infection)o No Have you had any tests for circulation on your legso No Hematologic/Lymphatic Medical History: Negative for: Anemia; Hemophilia; Human  Immunodeficiency Virus; Lymphedema; Sickle Cell Disease Respiratory Medical History: Negative for: Aspiration; Asthma; Chronic Obstructive Pulmonary Disease (COPD); Pneumothorax; Sleep Apnea; Tuberculosis Cardiovascular Medical History: Negative for: Angina; Arrhythmia; Congestive Heart Failure; Coronary Artery Disease; Deep Vein Thrombosis; Hypertension; Hypotension; Myocardial Infarction; Peripheral Arterial Disease; Peripheral Venous Disease; Phlebitis; Vasculitis Gastrointestinal Medical History: Negative for: Cirrhosis ; Colitis; Crohnos; Hepatitis A; Hepatitis B; Hepatitis C Endocrine Medical History: Negative for: Type I Diabetes; Type II Diabetes Immunological Medical History: Negative for: Lupus Erythematosus; Raynaudos; Scleroderma Musculoskeletal Dawn Bowers, Dawn Bowers ([id number]) Medical History: Negative for: Gout; Rheumatoid Arthritis; Osteoarthritis; Osteomyelitis Neurologic Medical History: Negative for: Dementia; Neuropathy Oncologic Medical History: Negative for: Received Chemotherapy; Received Radiation Immunizations Pneumococcal Vaccine: Received Pneumococcal Vaccination: No Immunization Notes: up to date Implantable Devices Family and Social History Cancer: No; Diabetes: Yes - Maternal Grandparents; Heart Disease: Yes - Maternal Grandparents,Paternal Grandparents; Hereditary Spherocytosis: No; Hypertension: Yes - Maternal Grandparents,Paternal Grandparents; Kidney Disease: No; Lung Disease: No; Seizures: No; Stroke: No; Thyroid Problems: No; Tuberculosis: No; Former smoker; Marital Status - Single; Alcohol Use: Moderate; Drug Use: No History; Caffeine Use: Moderate; Financial Concerns: No; Food, Clothing or Shelter Needs: No; Support System Lacking: No; Transportation Concerns: No; Advanced Directives: No; Patient does not want information on Advanced Directives  Physician Affirmation I have reviewed and agree with the above information. Electronic  Signature(s) Signed: 02/19/2018 4:33:49 PM By: Alejandro MullingPinkerton, Debra Signed: 02/19/2018 5:22:04 PM By: Bonnell Publicoulter, Jayanna Kroeger Entered By: Bonnell Publicoulter, Jamillia Closson on 02/19/2018 10:10:56 DawsonHARLES, Dawn Bowers ([id number]) -------------------------------------------------------------------------------- SuperBill Details Patient Name: Dawn DingwallHARLES, Dawn Bowers Date of Service: 02/19/2018 Medical Record Number: [id number] Patient Account Number: [id number] Date of Birth/Sex: 10/10/1986 (32 y.o. Female) Treating RN: Ashok CordiaPinkerton, Debi Primary Care Provider: PATIENT, NO Other Clinician: Referring Provider: Ronney LionHAZY, MARIAN Treating Provider/Extender: Kathreen Cosieroulter, Suzi Hernan Weeks in Treatment: 2 Diagnosis Coding ICD-10 Codes Code Description S61.211S Laceration without foreign body of left index finger without damage to nail, sequela Facility Procedures CPT4 Code Description: [id number] 11042 - DEB SUBQ TISSUE 20 SQ CM/< ICD-10 Diagnosis Description S61.211S Laceration without foreign body of left index finger without da Modifier: mage to nail, s Quantity: 1 equela Physician Procedures CPT4 Code Description: [id number] 11042 - WC PHYS SUBQ TISS 20 SQ CM ICD-10 Diagnosis Description S61.211S Laceration without foreign body of left index finger without da Modifier: mage to nail, se Quantity: 1 quela Electronic Signature(s) Signed: 02/19/2018 10:11:30 AM By: Bonnell Publicoulter, Leiland Mihelich Entered By: Bonnell Publicoulter, Brenley Priore on 02/19/2018 10:11:29

## 2018-02-20 NOTE — Progress Notes (Signed)
NIHARIKA, SAVINO ([id number]) Visit Report for 02/12/2018 Chief Complaint Document Details Patient Name: Dawn Bowers, Dawn Bowers Date of Service: 02/12/2018 10:00 AM Medical Record Number: [id number] Patient Account Number: [id number] Date of Birth/Sex: 1986/01/23 (32 y.o. Female) Treating RN: Ashok Cordia, Debi Primary Care Provider: PATIENT, NO Other Clinician: Referring Provider: Ronney Lion Treating Provider/Extender: Kathreen Cosier in Treatment: 1 Information Obtained from: Patient Chief Complaint She is here for evaluation of left finger laceration Electronic Signature(s) Signed: 02/12/2018 12:05:29 PM By: Bonnell Public Previous Signature: 02/12/2018 11:16:38 AM Version By: Bonnell Public Entered By: Bonnell Public on 02/12/2018 12:05:28 [place], SORN ([id number]) -------------------------------------------------------------------------------- Debridement Details Patient Name: [name] Date of Service: 02/12/2018 10:00 AM Medical Record Number: [id number] Patient Account Number: [id number] Date of Birth/Sex: 1986/10/12 (32 y.o. Female) Treating RN: Ashok Cordia, Debi Primary Care Provider: PATIENT, NO Other Clinician: Referring Provider: Ronney Lion Treating Provider/Extender: Kathreen Cosier in Treatment: 1 Debridement Performed for Wound #1 Left Hand - 2nd Digit Assessment: Performed By: Physician Bonnell Public, NP Debridement: Debridement Pre-procedure Verification/Time Yes - 10:18 Out Taken: Start Time: 10:19 Pain Control: Lidocaine 4% Topical Solution Level: Skin/Subcutaneous Tissue Total Area Debrided (L x W): 0.6 (cm) x 0.2 (cm) = 0.12 (cm) Tissue and other material Viable, Non-Viable, Exudate, Fibrin/Slough, Subcutaneous debrided: Instrument: Curette Bleeding: Minimum Hemostasis Achieved: Pressure End Time: 10:21 Procedural Pain: 0 Post Procedural Pain: 0 Response to Treatment: Procedure was tolerated well Post Debridement Measurements of Total  Wound Length: (cm) 0.6 Width: (cm) 0.2 Depth: (cm) 0.3 Volume: (cm) 0.028 Character of Wound/Ulcer Post Debridement: Requires Further Debridement Post Procedure Diagnosis Same as Pre-procedure Electronic Signature(s) Signed: 02/12/2018 4:59:29 PM By: Alejandro Mulling Signed: 02/19/2018 1:53:22 PM By: Bonnell Public Entered By: Alejandro Mulling on 02/12/2018 10:21:47 [place], [name] ([id number]) -------------------------------------------------------------------------------- HPI Details Patient Name: [name] Date of Service: 02/12/2018 10:00 AM Medical Record Number: [id number] Patient Account Number: [id number] Date of Birth/Sex: [birth year] (32 y.o. Female) Treating RN: Ashok Cordia, Debi Primary Care Provider: PATIENT, NO Other Clinician: Referring Provider: Ronney Lion Treating Provider/Extender: Kathreen Cosier in Treatment: 1 History of Present Illness Location: left second finger Quality: sharp and throbbing Severity: 3-5/10 when cleansed/interrogated Duration: occurred on 02/01/18 Timing: pain is intermittent Context: traumatic injury, cut on glass while washing dishes HPI Description: 02/05/18 She is her for evaluation of a left pointer finger. She was washing a wine glass under a running faucet, not in dirty dishwater, when the glass broke of lacerated her finger. [place] of [place] injury, presented to urgent care on 2/18 where they used silver nitrate for hemostasis. She has been dressing the finger with neosporin since then. There is evidence of residual silver nitrate and the wound bed, does not appear infected. She has extreme anxiety, crying throughout nurse triage and tearful during my interview and wound evaluation. We will treat with Prisma and follow up next week. She has been encouraged to keep dressing cdi, change if dirty or soiled. We reviewed s/s of infection and to notify us during business hours or report to urgent care for  evaluation. 02/12/18 she is here in follow-up evaluation for a left pointer finger laceration. There is improvement in the wound with a small amount of residual silver nitrate. She is tolerating prisma dressings with hydrogel. She'll follow-up next week Electronic Signature(s) Signed: 02/12/2018 12:06:10 PM By: Bonnell Public Entered By: Bonnell Public on 02/12/2018 12:06:09 JAMIE-LEE, GALDAMEZ ([id number]) -------------------------------------------------------------------------------- Physician Orders Details Patient Name: [name] Date of Service: 02/12/2018 10:00 AM Medical Record Number: [id number]  Patient Account Number: [id number] Date of Birth/Sex: [birth year] (32 y.o. Female) Treating RN: Ashok Cordia, Debi Primary Care Provider: PATIENT, NO Other Clinician: Referring Provider: Ronney Lion Treating Provider/Extender: Kathreen Cosier in Treatment: 1 Verbal / Phone Orders: Yes Clinician: Pinkerton, Debi Read Back and Verified: Yes Diagnosis Coding Wound Cleansing Wound #1 Left Hand - 2nd Digit o Clean wound with Normal Saline. o Cleanse wound with mild soap and water o May Shower, gently pat wound dry prior to applying new dressing. Anesthetic (add to Medication List) Wound #1 Left Hand - 2nd Digit o Topical Lidocaine 4% cream applied to wound bed prior to debridement (In Clinic Only). Primary Wound Dressing Wound #1 Left Hand - 2nd Digit o Hydrogel o Prisma Ag - moisten with hydrogel Secondary Dressing Wound #1 Left Hand - 2nd Digit o Dry Gauze o Conform/Kerlix o Other - stretch netting #2 Dressing Change Frequency Wound #1 Left Hand - 2nd Digit o Change dressing every other day. o Other: - as needed Follow-up Appointments Wound #1 Left Hand - 2nd Digit o Return Appointment in 1 week. Additional Orders / Instructions Wound #1 Left Hand - 2nd Digit o Increase protein intake. Patient Medications Allergies: No Known  Allergies Notifications Medication Indication Start End lidocaine DOSE 1 - topical 4 % cream - 1 cream topical [place], Larae ([id number]) Electronic Signature(s) Signed: 02/19/2018 1:53:22 PM By: Bonnell Public Entered By: Bonnell Public on 02/12/2018 12:06:36 [place], [name] ([id number]) -------------------------------------------------------------------------------- Prescription 02/12/2018 Patient Name: [name] Provider: Bonnell Public NP Date of Birth: 06/01/1986 NPI#: [id number] Sex: F DEA#: [id number] Phone #: [phone] License #: Patient Address: [facility] [address] [facility] [address] [address] [facility] [place], [place] [address] [address] [place], [place] [address] [phone] Allergies No Known Allergies Medication Medication: Route: Strength: Form: lidocaine 4 % topical cream topical 4% cream Class: TOPICAL LOCAL ANESTHETICS Dose: Frequency / Time: Indication: 1 1 cream topical Number of Refills: Number of Units: 0 Generic Substitution: Start Date: End Date: One Time Use: Substitution Permitted No Note to Pharmacy: Signature(s): Date(s): Electronic Signature(s) Signed: 02/19/2018 1:53:22 PM By: Bonnell Public Entered By: Bonnell Public on 02/12/2018 12:06:36 [place], [name] ([id number]) --------------------------------------------------------------------------------  Problem List Details Patient Name: [name] Date of Service: 02/12/2018 10:00 AM Medical Record Number: [id number] Patient Account Number: [id number] Date of Birth/Sex: 1986-03-04 (32 y.o. Female) Treating RN: Ashok Cordia, Debi Primary Care Provider: PATIENT, NO Other Clinician: Referring Provider: Ronney Lion Treating Provider/Extender: Kathreen Cosier in Treatment: 1 Active Problems ICD-10 Encounter Code Description Active Date Diagnosis S61.211S Laceration without foreign body of left index finger without damage  02/05/2018 Yes to nail, sequela Inactive Problems Resolved Problems Electronic Signature(s) Signed: 02/12/2018 12:05:11 PM By: Bonnell Public Previous Signature: 02/12/2018 11:16:17 AM Version By: Bonnell Public Entered By: Bonnell Public on 02/12/2018 12:05:11 [place], [name] ([id number]) -------------------------------------------------------------------------------- Progress Note Details Patient Name: [name] Date of Service: 02/12/2018 10:00 AM Medical Record Number: [id number] Patient Account Number: [id number] Date of Birth/Sex: March 23, 1986 (32 y.o. Female) Treating RN: Ashok Cordia, Debi Primary Care Provider: PATIENT, NO Other Clinician: Referring Provider: Ronney Lion Treating Provider/Extender: Kathreen Cosier in Treatment: 1 Subjective Chief Complaint Information obtained from Patient She is here for evaluation of left finger laceration History of Present Illness (HPI) The following HPI elements were documented for the patient's wound: Location: left second finger Quality: sharp and throbbing Severity: 3-5/10 when cleansed/interrogated Duration: occurred on 02/01/18 Timing: pain is intermittent Context: traumatic injury, cut on glass while washing dishes 02/05/18 She is  her for evaluation of a left pointer finger. She was washing a wine glass under a running faucet, not in dirty dishwater, when the glass broke of lacerated her finger. [place] was out of state at the time of the injury, presented to urgent care on 2/18 where they used silver nitrate for hemostasis. She has been dressing the finger with neosporin since then. There is evidence of residual silver nitrate and the wound bed, does not appear infected. She has extreme anxiety, crying throughout nurse triage and tearful during my interview and wound evaluation. We will treat with Prisma and follow up next week. She has been encouraged to keep dressing cdi, change if dirty or soiled. We reviewed s/s of infection and  to notify us during business hours or report to urgent care for evaluation. 02/12/18 she is here in follow-up evaluation for a left pointer finger laceration. There is improvement in the wound with a small amount of residual silver nitrate. She is tolerating prisma dressings with hydrogel. She'll follow-up next week Patient History Information obtained from Patient. Family History Diabetes - Maternal Grandparents, Heart Disease - Maternal Grandparents,Paternal Grandparents, Hypertension - Maternal Grandparents,Paternal Grandparents, No family history of Cancer, Hereditary Spherocytosis, Kidney Disease, Lung Disease, Seizures, Stroke, Thyroid Problems, Tuberculosis. Social History Former smoker, Marital Status - Single, Alcohol Use - Moderate, Drug Use - No History, Caffeine Use - Moderate. [place] ([id number]) Objective Constitutional Vitals Time Taken: 10:07 AM, Height: 62 in, Weight: 114 lbs, BMI: 20.8, Temperature: 98.3 F, Pulse: 78 bpm, Respiratory Rate: 16 breaths/min, Blood Pressure: 110/56 mmHg. Integumentary (Hair, Skin) Wound #1 status is Open. Original cause of wound was Trauma. The wound is located on the Left Hand - 2nd Digit. The wound measures 0.6cm length x 0.2cm width x 0.2cm depth; 0.094cm^2 area and 0.019cm^3 volume. There is Fat Layer (Subcutaneous Tissue) Exposed exposed. There is no tunneling or undermining noted. There is a large amount of sanguinous drainage noted. The wound margin is flat and intact. There is large (67-100%) red granulation within the wound bed. There is a small (1-33%) amount of necrotic tissue within the wound bed including Adherent Slough. The periwound skin appearance exhibited: Erythema. The periwound skin appearance did not exhibit: Callus, Crepitus, Excoriation, Induration, Rash, Scarring, Dry/Scaly, Maceration, Atrophie Blanche, Cyanosis, Ecchymosis, Hemosiderin Staining, Mottled, Pallor, Rubor. The surrounding wound skin color is  noted with erythema which is circumferential. Periwound temperature was noted as No Abnormality. The periwound has tenderness on palpation. Assessment Active Problems ICD-10 S61.211S - Laceration without foreign body of left index finger without damage to nail, sequela Procedures Wound #1 Pre-procedure diagnosis of Wound #1 is a Trauma, Other located on the Left Hand - 2nd Digit . There was a Skin/Subcutaneous Tissue Debridement ([id number]) debridement with total area of 0.12 sq cm performed by Bonnell Publicoulter, Tehya Leath, NP. with the following instrument(s): Curette to remove Viable and Non-Viable tissue/material including Exudate, Fibrin/Slough, and Subcutaneous after achieving pain control using Lidocaine 4% Topical Solution. A time out was conducted at 10:18, prior to the start of the procedure. A Minimum amount of bleeding was controlled with Pressure. The procedure was tolerated well with a pain level of 0 throughout and a pain level of 0 following the procedure. Post Debridement Measurements: 0.6cm length x 0.2cm width x 0.3cm depth; 0.028cm^3 volume. Character of Wound/Ulcer Post Debridement requires further debridement. Post procedure Diagnosis Wound #1: Same as Pre-Procedure Plan Wound Cleansing: Wound #1 Left Hand - 2nd Digit: Clean wound with Normal Saline. Cleanse wound with mild soap  and water DRIANNA, CHANDRAN ([id number]) May Shower, gently pat wound dry prior to applying new dressing. Anesthetic (add to Medication List): Wound #1 Left Hand - 2nd Digit: Topical Lidocaine 4% cream applied to wound bed prior to debridement (In Clinic Only). Primary Wound Dressing: Wound #1 Left Hand - 2nd Digit: Hydrogel Prisma Ag - moisten with hydrogel Secondary Dressing: Wound #1 Left Hand - 2nd Digit: Dry Gauze Conform/Kerlix Other - stretch netting #2 Dressing Change Frequency: Wound #1 Left Hand - 2nd Digit: Change dressing every other day. Other: - as needed Follow-up  Appointments: Wound #1 Left Hand - 2nd Digit: Return Appointment in 1 week. Additional Orders / Instructions: Wound #1 Left Hand - 2nd Digit: Increase protein intake. The following medication(s) was prescribed: lidocaine topical 4 % cream 1 1 cream topical was prescribed at facility 1. Continue with Prisma 2. Follow-up next week Electronic Signature(s) Signed: 02/12/2018 12:06:50 PM By: Bonnell Public Entered By: Bonnell Public on 02/12/2018 12:06:50 RIELYNN, TRULSON ([id number]) -------------------------------------------------------------------------------- ROS/PFSH Details Patient Name: [name] Date of Service: 02/12/2018 10:00 AM Medical Record Number: [id number] Patient Account Number: [id number] Date of Birth/Sex: [birth year] (32 y.o. Female) Treating RN: Ashok Cordia, Debi Primary Care Provider: PATIENT, NO Other Clinician: Referring Provider: Ronney Lion Treating Provider/Extender: Kathreen Cosier in Treatment: 1 Information Obtained From Patient Wound History Do you currently have one or more open woundso Yes How many open wounds do you currently haveo 1 Approximately how long have you had your woundso 1 week How have you been treating your wound(s) until nowo neosporin Has your wound(s) ever healed and then re-openedo No Have you had any lab work done in the past montho No Have you tested positive for an antibiotic resistant organism (MRSA, VRE)o No Have you tested positive for osteomyelitis (bone infection)o No Have you had any tests for circulation on your legso No Hematologic/Lymphatic Medical History: Negative for: Anemia; Hemophilia; Human Immunodeficiency Virus; Lymphedema; Sickle Cell Disease Respiratory Medical History: Negative for: Aspiration; Asthma; Chronic Obstructive Pulmonary Disease (COPD); Pneumothorax; Sleep Apnea; Tuberculosis Cardiovascular Medical History: Negative for: Angina; Arrhythmia; Congestive Heart Failure; Coronary Artery Disease;  Deep Vein Thrombosis; Hypertension; Hypotension; Myocardial Infarction; Peripheral Arterial Disease; Peripheral Venous Disease; Phlebitis; Vasculitis Gastrointestinal Medical History: Negative for: Cirrhosis ; Colitis; Crohnos; Hepatitis A; Hepatitis B; Hepatitis C Endocrine Medical History: Negative for: Type I Diabetes; Type II Diabetes Immunological Medical History: Negative for: Lupus Erythematosus; Raynaudos; Scleroderma Musculoskeletal DREYA, BUHRMAN ([id number]) Medical History: Negative for: Gout; Rheumatoid Arthritis; Osteoarthritis; Osteomyelitis Neurologic Medical History: Negative for: Dementia; Neuropathy Oncologic Medical History: Negative for: Received Chemotherapy; Received Radiation Immunizations Pneumococcal Vaccine: Received Pneumococcal Vaccination: No Immunization Notes: up to date Implantable Devices Family and Social History Cancer: No; Diabetes: Yes - Maternal Grandparents; Heart Disease: Yes - Maternal Grandparents,Paternal Grandparents; Hereditary Spherocytosis: No; Hypertension: Yes - Maternal Grandparents,Paternal Grandparents; Kidney Disease: No; Lung Disease: No; Seizures: No; Stroke: No; Thyroid Problems: No; Tuberculosis: No; Former smoker; Marital Status - Single; Alcohol Use: Moderate; Drug Use: No History; Caffeine Use: Moderate; Financial Concerns: No; Food, Clothing or Shelter Needs: No; Support System Lacking: No; Transportation Concerns: No; Advanced Directives: No; Patient does not want information on Advanced Directives Physician Affirmation I have reviewed and agree with the above information. Electronic Signature(s) Signed: 02/12/2018 4:59:29 PM By: Alejandro Mulling Signed: 02/19/2018 1:53:22 PM By: Bonnell Public Entered By: Bonnell Public on 02/12/2018 12:06:21 MA, MUNOZ ([id number]) -------------------------------------------------------------------------------- SuperBill Details Patient Name: [name] Date of Service:  02/12/2018 Medical Record Number: [id number] Patient Account Number: [id number] Date of Birth/Sex:  Jun 17, 1986 (32 y.o. Female) Treating RN: Ashok Cordia, Debi Primary Care Provider: PATIENT, NO Other Clinician: Referring Provider: Ronney Lion Treating Provider/Extender: Kathreen Cosier in Treatment: 1 Diagnosis Coding ICD-10 Codes Code Description S61.211S Laceration without foreign body of left index finger without damage to nail, sequela Facility Procedures CPT4 Code Description: [id number] 11042 - DEB SUBQ TISSUE 20 SQ CM/< ICD-10 Diagnosis Description S61.211S Laceration without foreign body of left index finger without da Modifier: mage to nail, s Quantity: 1 equela Physician Procedures CPT4 Code Description: 5409811 11042 - WC PHYS SUBQ TISS 20 SQ CM ICD-10 Diagnosis Description S61.211S Laceration without foreign body of left index finger without da Modifier: mage to nail, se Quantity: 1 quela Electronic Signature(s) Signed: 02/12/2018 12:06:57 PM By: Bonnell Public Entered By: Bonnell Public on 02/12/2018 12:06:57

## 2018-02-20 NOTE — Progress Notes (Signed)
JODEEN, MCLIN ([id number]) Visit Report for 02/19/2018 Arrival Information Details Patient Name: Dawn Bowers, Dawn Bowers Date of Service: 02/19/2018 9:30 AM Medical Record Number: [id number] Patient Account Number: [id number] Date of Birth/Sex: 1986/02/01 (31 y.o. Female) Treating RN: Curtis Sites Primary Care Sang Blount: PATIENT, NO Other Clinician: Referring Jinny Sweetland: Ronney Lion Treating Malley Hauter/Extender: Kathreen Cosier in Treatment: 2 Visit Information History Since Last Visit Added or deleted any medications: No Patient Arrived: Ambulatory Any new allergies or adverse reactions: No Arrival Time: 09:42 Had a fall or experienced change in No Accompanied By: self activities of daily living that may affect Transfer Assistance: None risk of falls: Patient Identification Verified: Yes Signs or symptoms of abuse/neglect since last visito No Secondary Verification Process Completed: Yes Hospitalized since last visit: No Has Dressing in Place as Prescribed: Yes Pain Present Now: No Electronic Signature(s) Signed: 02/19/2018 5:02:17 PM By: Curtis Sites Entered By: Curtis Sites on 02/19/2018 09:43:04 [place], [name] ([id number]) -------------------------------------------------------------------------------- Encounter Discharge Information Details Patient Name: Dawn Bowers Date of Service: 02/19/2018 9:30 AM Medical Record Number: [id number] Patient Account Number: [id number] Date of Birth/Sex: 1985/12/29 (31 y.o. Female) Treating RN: Ashok Cordia, Debi Primary Care Dolores Mcgovern: PATIENT, NO Other Clinician: Referring Dequann Vandervelden: Ronney Lion Treating Turkessa Ostrom/Extender: Kathreen Cosier in Treatment: 2 Encounter Discharge Information Items Discharge Pain Level: 0 Discharge Condition: Stable Ambulatory Status: Ambulatory Discharge Destination: Home Transportation: Private Auto Accompanied By: self Schedule Follow-up Appointment: Yes Medication Reconciliation completed  and No provided to Patient/Care Cesar Alf: Provided on Clinical Summary of Care: 02/19/2018 Form Type Recipient Paper Patient [facility] Electronic Signature(s) Signed: 02/19/2018 4:51:26 PM By: Renne Crigler Entered By: Renne Crigler on 02/19/2018 10:21:51 [place], [name] ([id number]) -------------------------------------------------------------------------------- Lower Extremity Assessment Details Patient Name: Dawn Bowers Date of Service: 02/19/2018 9:30 AM Medical Record Number: [id number] Patient Account Number: [id number] Date of Birth/Sex: 1986-07-24 (31 y.o. Female) Treating RN: Curtis Sites Primary Care Ellamay Fors: PATIENT, NO Other Clinician: Referring Mayrin Schmuck: Ronney Lion Treating Melvin Marmo/Extender: Kathreen Cosier in Treatment: 2 Electronic Signature(s) Signed: 02/19/2018 5:02:17 PM By: Curtis Sites Entered By: Curtis Sites on 02/19/2018 09:50:40 [place], Keesha ([id number]) -------------------------------------------------------------------------------- Multi Wound Chart Details Patient Name: Dawn Bowers Date of Service: 02/19/2018 9:30 AM Medical Record Number: [id number] Patient Account Number: [id number] Date of Birth/Sex: [birth year] (31 y.o. Female) Treating RN: Ashok Cordia, Debi Primary Care Islah Eve: PATIENT, NO Other Clinician: Referring Avie Checo: Ronney Lion Treating Ndia Sampath/Extender: Kathreen Cosier in Treatment: 2 Vital Signs Height(in): 62 Pulse(bpm): 73 Weight(lbs): 114 Blood Pressure(mmHg): 113/79 Body Mass Index(BMI): 21 Temperature(F): 98.1 Respiratory Rate 16 (breaths/min): Photos: [1:No Photos] [N/A:N/A] Wound Location: [1:Left Hand - 2nd Digit] [N/A:N/A] Wounding Event: [1:Trauma] [N/A:N/A] Primary Etiology: [1:Trauma, Other] [N/A:N/A] Date Acquired: [1:02/01/2018] [N/A:N/A] Weeks of Treatment: [1:2] [N/A:N/A] Wound Status: [1:Open] [N/A:N/A] Measurements L x W x D [1:0.9x0.3x0.1] [N/A:N/A] (cm) Area (cm) : [1:0.212]  [N/A:N/A] Volume (cm) : [1:0.021] [N/A:N/A] % Reduction in Area: [1:43.80%] [N/A:N/A] % Reduction in Volume: [1:72.00%] [N/A:N/A] Position 1 (o'clock): [1:12] Maximum Distance 1 (cm): [1:0.3] Tunneling: [1:Yes] [N/A:N/A] Classification: [1:Full Thickness Without Exposed Support Structures] [N/A:N/A] Exudate Amount: [1:Large] [N/A:N/A] Exudate Type: [1:Sanguinous] [N/A:N/A] Exudate Color: [1:red] [N/A:N/A] Wound Margin: [1:Flat and Intact] [N/A:N/A] Granulation Amount: [1:Large (67-100%)] [N/A:N/A] Granulation Quality: [1:Red] [N/A:N/A] Necrotic Amount: [1:Small (1-33%)] [N/A:N/A] Exposed Structures: [1:Fat Layer (Subcutaneous Tissue) Exposed: Yes Fascia: No Tendon: No Muscle: No Joint: No Bone: No] [N/A:N/A] Epithelialization: [1:None] [N/A:N/A] Debridement: [1:Debridement (11042-11047)] [N/A:N/A] Pre-procedure [1:10:03] [N/A:N/A] Verification/Time Out Taken: Pain Control: [1:Lidocaine 4% Topical Solution] [N/A:N/A] Tissue Debrided: Fibrin/Slough, Exudates, N/A N/A Subcutaneous Level: Skin/Subcutaneous Tissue N/A N/A Debridement Area (sq  cm): 0.27 N/A N/A Instrument: Blade N/A N/A Bleeding: Minimum N/A N/A Hemostasis Achieved: Pressure N/A N/A Procedural Pain: 0 N/A N/A Post Procedural Pain: 0 N/A N/A Debridement Treatment Procedure was tolerated well N/A N/A Response: Post Debridement 0.9x0.3x0.2 N/A N/A Measurements L x W x D (cm) Post Debridement Volume: 0.042 N/A N/A (cm) Periwound Skin Texture: Excoriation: No N/A N/A Induration: No Callus: No Crepitus: No Rash: No Scarring: No Periwound Skin Moisture: Maceration: No N/A N/A Dry/Scaly: No Periwound Skin Color: Erythema: Yes N/A N/A Atrophie Blanche: No Cyanosis: No Ecchymosis: No Hemosiderin Staining: No Mottled: No Pallor: No Rubor: No Erythema Location: Circumferential N/A N/A Temperature: No Abnormality N/A N/A Tenderness on Palpation: Yes N/A N/A Wound Preparation: Ulcer Cleansing: N/A  N/A Rinsed/Irrigated with Saline Topical Anesthetic Applied: Other: lidocaine 4% Procedures Performed: Debridement N/A N/A Treatment Notes Electronic Signature(s) Signed: 02/19/2018 10:09:23 AM By: Bonnell Public Entered By: Bonnell Public on 02/19/2018 10:09:23 Dawn Bowers, Dawn Bowers ([id number]) -------------------------------------------------------------------------------- Multi-Disciplinary Care Plan Details Patient Name: Dawn Bowers Date of Service: 02/19/2018 9:30 AM Medical Record Number: [id number] Patient Account Number: [id number] Date of Birth/Sex: Feb 27, 1986 (31 y.o. Female) Treating RN: Ashok Cordia, Debi Primary Care Alexys Lobello: PATIENT, NO Other Clinician: Referring Arvid Marengo: Ronney Lion Treating Daviyon Widmayer/Extender: Kathreen Cosier in Treatment: 2 Active Inactive ` Orientation to the Wound Care Program Nursing Diagnoses: Knowledge deficit related to the wound healing center program Goals: Patient/caregiver will verbalize understanding of the Wound Healing Center Program Date Initiated: 02/05/2018 Target Resolution Date: 02/21/2018 Goal Status: Active Interventions: Provide education on orientation to the wound center Notes: ` Pain, Acute or Chronic Nursing Diagnoses: Pain, acute or chronic: actual or potential Potential alteration in comfort, pain Goals: Patient/caregiver will verbalize adequate pain control between visits Date Initiated: 02/05/2018 Target Resolution Date: 05/23/2018 Goal Status: Active Interventions: Complete pain assessment as per visit requirements Notes: ` Wound/Skin Impairment Nursing Diagnoses: Impaired tissue integrity Knowledge deficit related to ulceration/compromised skin integrity Goals: Ulcer/skin breakdown will have a volume reduction of 80% by week 12 Date Initiated: 02/05/2018 Target Resolution Date: 04/18/2018 Goal Status: Active [place], Dawn Bowers ([id number]) Interventions: Assess ulceration(s) every visit Notes: Electronic  Signature(s) Signed: 02/19/2018 4:33:49 PM By: Alejandro Mulling Entered By: Alejandro Mulling on 02/19/2018 10:03:22 Dawn Bowers, Dawn Bowers ([id number]) -------------------------------------------------------------------------------- Pain Assessment Details Patient Name: Dawn Bowers Date of Service: 02/19/2018 9:30 AM Medical Record Number: [id number] Patient Account Number: [id number] Date of Birth/Sex: 08-Oct-1986 (31 y.o. Female) Treating RN: Curtis Sites Primary Care Rosalita Carey: PATIENT, NO Other Clinician: Referring Ebonique Hallstrom: Ronney Lion Treating Dilraj Killgore/Extender: Kathreen Cosier in Treatment: 2 Active Problems Location of Pain Severity and Description of Pain Patient Has Paino No Site Locations Pain Management and Medication Current Pain Management: Notes Topical or injectable lidocaine is offered to patient for acute pain when surgical debridement is performed. If needed, Patient is instructed to use over the counter pain medication for the following 24-48 hours after debridement. Wound care MDs do not prescribed pain medications. Patient has chronic pain or uncontrolled pain. Patient has been instructed to make an appointment with their Primary Care Physician for pain management. Electronic Signature(s) Signed: 02/19/2018 5:02:17 PM By: Curtis Sites Entered By: Curtis Sites on 02/19/2018 09:43:18 [place], [name] ([id number]) -------------------------------------------------------------------------------- Patient/Caregiver Education Details Patient Name: Dawn Bowers Date of Service: 02/19/2018 9:30 AM Medical Record Number: [id number] Patient Account Number: [id number] Date of Birth/Gender: 1986-06-28 (31 y.o. Female) Treating RN: Renne Crigler Primary Care Physician: PATIENT, NO Other Clinician: Referring Physician: Ronney Lion Treating Physician/Extender: Kathreen Cosier in Treatment: 2 Education Assessment Education Provided To: Patient  Education Topics  Provided Wound/Skin Impairment: Handouts: Caring for Your Ulcer Methods: Explain/Verbal Responses: State content correctly Electronic Signature(s) Signed: 02/19/2018 4:51:26 PM By: Renne CriglerFlinchum, Cheryl Entered By: Renne CriglerFlinchum, Cheryl on 02/19/2018 10:22:02 Dawn DingwallCHARLES, Sreya ([id number]) -------------------------------------------------------------------------------- Wound Assessment Details Patient Name: Dawn Bowers, Dawn Bowers Date of Service: 02/19/2018 9:30 AM Medical Record Number: [id number] Patient Account Number: [id number] Date of Birth/Sex: Apr 21, 1986 (31 y.o. Female) Treating RN: Curtis Sitesorthy, Joanna Primary Care Bharath Bernstein: PATIENT, NO Other Clinician: Referring Larken Urias: Ronney LionHAZY, MARIAN Treating Earlyn Sylvan/Extender: Kathreen Cosieroulter, Leah Weeks in Treatment: 2 Wound Status Wound Number: 1 Primary Etiology: Trauma, Other Wound Location: Left Hand - 2nd Digit Wound Status: Open Wounding Event: Trauma Date Acquired: 02/01/2018 Weeks Of Treatment: 2 Clustered Wound: No Photos Photo Uploaded By: Curtis Sitesorthy, Joanna on 02/19/2018 10:31:53 Wound Measurements Length: (cm) 0.9 Width: (cm) 0.3 Depth: (cm) 0.1 Area: (cm) 0.212 Volume: (cm) 0.021 % Reduction in Area: 43.8% % Reduction in Volume: 72% Epithelialization: None Tunneling: Yes Position (o'clock): 12 Maximum Distance: (cm) 0.3 Undermining: No Wound Description Full Thickness Without Exposed Support Classification: Structures Wound Margin: Flat and Intact Exudate Large Amount: Exudate Type: Sanguinous Exudate Color: red Foul Odor After Cleansing: No Slough/Fibrino Yes Wound Bed Granulation Amount: Large (67-100%) Exposed Structure Granulation Quality: Red Fascia Exposed: No Necrotic Amount: Small (1-33%) Fat Layer (Subcutaneous Tissue) Exposed: Yes Necrotic Quality: Adherent Slough Tendon Exposed: No Dawn Bowers, Dawn Bowers ([id number]) Muscle Exposed: No Joint Exposed: No Bone Exposed: No Periwound Skin Texture Texture Color No Abnormalities  Noted: No No Abnormalities Noted: No Callus: No Atrophie Blanche: No Crepitus: No Cyanosis: No Excoriation: No Ecchymosis: No Induration: No Erythema: Yes Rash: No Erythema Location: Circumferential Scarring: No Hemosiderin Staining: No Mottled: No Moisture Pallor: No No Abnormalities Noted: No Rubor: No Dry / Scaly: No Maceration: No Temperature / Pain Temperature: No Abnormality Tenderness on Palpation: Yes Wound Preparation Ulcer Cleansing: Rinsed/Irrigated with Saline Topical Anesthetic Applied: Other: lidocaine 4%, Treatment Notes Wound #1 (Left Hand - 2nd Digit) 1. Cleansed with: Clean wound with Normal Saline 2. Anesthetic Topical Lidocaine 4% cream to wound bed prior to debridement 4. Dressing Applied: Hydrogel Prisma Ag 5. Secondary Dressing Applied Dry Gauze Notes cover with stretch net to hold in place Electronic Signature(s) Signed: 02/19/2018 5:02:17 PM By: Curtis Sitesorthy, Joanna Entered By: Curtis Sitesorthy, Joanna on 02/19/2018 09:50:32 Dawn Bowers, Dawn Bowers ([id number]) -------------------------------------------------------------------------------- Dawn Bowers, Dawn Bowers Date of Service: 02/19/2018 9:30 AM Medical Record Number: [id number] Patient Account Number: [id number] Date of Birth/Sex: Apr 21, 1986 (31 y.o. Female) Treating RN: Curtis Sitesorthy, Joanna Primary Care Korryn Pancoast: PATIENT, NO Other Clinician: Referring Quy Lotts: Ronney LionHAZY, MARIAN Treating Drianna Chandran/Extender: Kathreen Cosieroulter, Leah Weeks in Treatment: 2 Vital Signs Time Taken: 09:43 Temperature (F): 98.1 Height (in): 62 Pulse (bpm): 73 Weight (lbs): 114 Respiratory Rate (breaths/min): 16 Body Mass Index (BMI): 20.8 Blood Pressure (mmHg): 113/79 Reference Range: 80 - 120 mg / dl Electronic Signature(s) Signed: 02/19/2018 5:02:17 PM By: Curtis Sitesorthy, Joanna Entered By: Curtis Sitesorthy, Joanna on 02/19/2018 09:45:39

## 2018-02-26 ENCOUNTER — Ambulatory Visit: Payer: 59 | Admitting: Physician Assistant
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
